# Patient Record
Sex: Male | Born: 1935 | Race: White | Hispanic: No | Marital: Married | Smoking: Never smoker
Health system: Southern US, Community
[De-identification: ages and names within clinical notes are randomized; demographics above are authoritative.]

## PROBLEM LIST (undated history)

## (undated) DIAGNOSIS — M1990 Unspecified osteoarthritis, unspecified site: Secondary | ICD-10-CM

## (undated) DIAGNOSIS — F0390 Unspecified dementia without behavioral disturbance: Secondary | ICD-10-CM

## (undated) DIAGNOSIS — N400 Enlarged prostate without lower urinary tract symptoms: Secondary | ICD-10-CM

## (undated) DIAGNOSIS — I10 Essential (primary) hypertension: Secondary | ICD-10-CM

## (undated) DIAGNOSIS — K589 Irritable bowel syndrome without diarrhea: Secondary | ICD-10-CM

## (undated) DIAGNOSIS — F419 Anxiety disorder, unspecified: Secondary | ICD-10-CM

## (undated) DIAGNOSIS — K219 Gastro-esophageal reflux disease without esophagitis: Secondary | ICD-10-CM

## (undated) HISTORY — PX: EYE SURGERY: SHX253

## (undated) HISTORY — PX: CHOLECYSTECTOMY: SHX55

---

## 1999-07-18 ENCOUNTER — Encounter: Admission: RE | Admit: 1999-07-18 | Discharge: 1999-07-18 | Payer: Self-pay | Admitting: Emergency Medicine

## 1999-07-18 ENCOUNTER — Encounter: Payer: Self-pay | Admitting: Emergency Medicine

## 1999-10-26 ENCOUNTER — Encounter: Payer: Self-pay | Admitting: Surgery

## 1999-10-26 ENCOUNTER — Ambulatory Visit (HOSPITAL_COMMUNITY): Admission: RE | Admit: 1999-10-26 | Discharge: 1999-10-26 | Payer: Self-pay | Admitting: Surgery

## 1999-11-05 ENCOUNTER — Ambulatory Visit (HOSPITAL_COMMUNITY): Admission: RE | Admit: 1999-11-05 | Discharge: 1999-11-05 | Payer: Self-pay | Admitting: Gastroenterology

## 1999-12-24 ENCOUNTER — Encounter: Payer: Self-pay | Admitting: Surgery

## 1999-12-28 ENCOUNTER — Inpatient Hospital Stay (HOSPITAL_COMMUNITY): Admission: RE | Admit: 1999-12-28 | Discharge: 1999-12-31 | Payer: Self-pay | Admitting: Surgery

## 2001-04-17 ENCOUNTER — Ambulatory Visit (HOSPITAL_COMMUNITY): Admission: RE | Admit: 2001-04-17 | Discharge: 2001-04-17 | Payer: Self-pay | Admitting: Gastroenterology

## 2001-08-17 ENCOUNTER — Encounter: Admission: RE | Admit: 2001-08-17 | Discharge: 2001-08-17 | Payer: Self-pay | Admitting: Emergency Medicine

## 2001-08-17 ENCOUNTER — Encounter: Payer: Self-pay | Admitting: Emergency Medicine

## 2003-01-07 ENCOUNTER — Encounter: Admission: RE | Admit: 2003-01-07 | Discharge: 2003-01-07 | Payer: Self-pay | Admitting: Orthopedic Surgery

## 2003-02-03 ENCOUNTER — Encounter: Admission: RE | Admit: 2003-02-03 | Discharge: 2003-02-03 | Payer: Self-pay | Admitting: Orthopedic Surgery

## 2003-02-07 ENCOUNTER — Ambulatory Visit (HOSPITAL_COMMUNITY): Admission: RE | Admit: 2003-02-07 | Discharge: 2003-02-07 | Payer: Self-pay | Admitting: Orthopedic Surgery

## 2003-02-07 ENCOUNTER — Ambulatory Visit (HOSPITAL_BASED_OUTPATIENT_CLINIC_OR_DEPARTMENT_OTHER): Admission: RE | Admit: 2003-02-07 | Discharge: 2003-02-07 | Payer: Self-pay | Admitting: Orthopedic Surgery

## 2003-09-29 ENCOUNTER — Encounter: Admission: RE | Admit: 2003-09-29 | Discharge: 2003-09-29 | Payer: Self-pay | Admitting: Emergency Medicine

## 2004-01-10 ENCOUNTER — Ambulatory Visit (HOSPITAL_COMMUNITY): Admission: RE | Admit: 2004-01-10 | Discharge: 2004-01-10 | Payer: Self-pay | Admitting: Surgery

## 2004-08-15 ENCOUNTER — Encounter: Admission: RE | Admit: 2004-08-15 | Discharge: 2004-08-15 | Payer: Self-pay | Admitting: Emergency Medicine

## 2004-12-24 ENCOUNTER — Encounter: Admission: RE | Admit: 2004-12-24 | Discharge: 2004-12-24 | Payer: Self-pay | Admitting: Orthopedic Surgery

## 2004-12-25 ENCOUNTER — Ambulatory Visit (HOSPITAL_COMMUNITY): Admission: RE | Admit: 2004-12-25 | Discharge: 2004-12-25 | Payer: Self-pay | Admitting: Orthopedic Surgery

## 2004-12-25 ENCOUNTER — Ambulatory Visit (HOSPITAL_BASED_OUTPATIENT_CLINIC_OR_DEPARTMENT_OTHER): Admission: RE | Admit: 2004-12-25 | Discharge: 2004-12-26 | Payer: Self-pay | Admitting: Orthopedic Surgery

## 2005-09-05 IMAGING — CT CT EXTREM LOW W/O CM*R*
1 of 4 series · 9 of 14 positions shown, 12 images · non-contrast
Comparison: none

CLINICAL DATA: Bilateral ankle pain secondary to trauma.  The patient stepped in a hole approximately 2-3 weeks ago.  
 CT SCAN OF THE RIGHT ANKLE WITHOUT CONTRAST
 Scans were performed in the coronal plane through the ankle and an axial plane through the foot.  Sagittal and coronal and axial reconstructions were performed.  The scan of the right ankle demonstrates that the patient has mild degenerative arthritis of the ankle joint, some mild joint space narrowing, and minimal spurring on the anterior aspect of the distal tibia.  There is also some mild degenerative arthritis of the posterior facet of the subtalar joint.  There is a tiny calcification seen at the posterior margin of the talocalcaneal joint.  I suspect there is some very slight marginal spurring rather than a loose body.  There is a small calcification adjacent to the tip of the medial malleolus which is felt to represent an old avulsion fragment.  There are minimal degenerative changes of the calcaneocuboid joint.  
 No other significant abnormality. 
 IMPRESSION
 Mild degenerative arthritic changes of the ankle joint with a small old avulsion from the tip of the medial malleolus.  Mild degenerative changes of the posterior aspect of the talocalcaneal joint and of the calcaneocuboid joint.  No acute bony abnormality of the right ankle. 
 MULTIPLANAR RECONSTRUCTIONS
 Multiplanar reconstructions of the right ankle better demonstrate the degenerative changes of the ankle joint and calcaneocuboid joint.  
 Multiplanar reconstructions better demonstrate the degenerative changes.  
 CT SCAN OF THE LEFT ANKLE WITHOUT CONTRAST
 Scans were performed in the coronal plane through the ankle and the axial plane through the foot and multiplanar reconstructions were performed in sagittal, coronal, and axial positions.  
 Scan demonstrates that the patient has moderately severe degenerative arthritis of the ankle joint with several calcified loose bodies in the posterior medial aspect of the ankle joint.  There are multiple old avulsions from the medial malleolus.  There is an osteochondral lesion of the medial posterior aspect of the dome of the talus and the loose bodies overly this area.  This lucency in the talar dome at that site is consistent with edema and osteopenia.  Bony articular surface of the posterior dome of the talus is quite irregular consistent with chronic degenerative changes.  
 The patient has multiple loose bodies in the posterior aspect of the ankle joint and in the posterior recess of the subtalar joint.  The patient has severe degenerative arthritis of the subtalar joint with small cystic subchondral erosions of the posterior facet of the talus.  There are mild degenerative changes of the middle facet of the subtalar joint as well.  There are prominent subchondral cysts of the lateral aspect of the posterior facet of the calcaneus.  
 There are mild degenerative arthritic changes of the calcaneocuboid joint with joint space narrowing.  There is no acute bony abnormality. 
 Severe degenerative arthritic changes of the subtalar joint and ankle joint with multiple loose bodies in both those joints.  Some of the loose bodies are actually hinged between the distal tibia and the dome of the talus and there is an osteochondral lesion of the medial aspect of the dome of the talus.  
 The multiplanar reconstructions of the left ankle better demonstrate the relationship of the numerous foreign bodies and better demonstrate the degenerative arthritic changes of the ankle joint and subtalar joint. 
 Multiplanar reconstructions better demonstrate the arthritic changes as described.

[Series 102: ankle lower ext · axial · 0.55mm/px · z∈[-119,+7]mm · 9 of 379 slices shown, 12 images]
[im 38/379  soft-tissue]
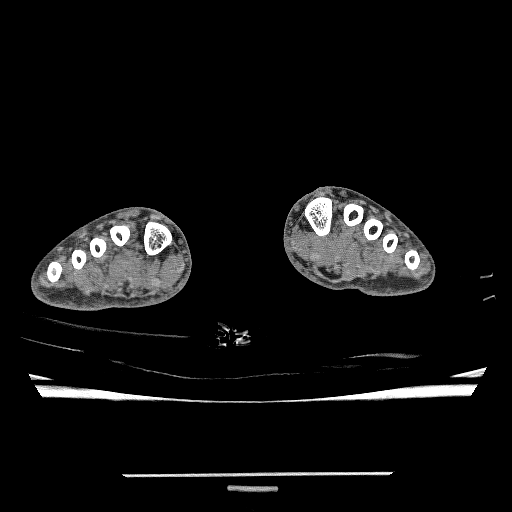
[im 38/379  bone]
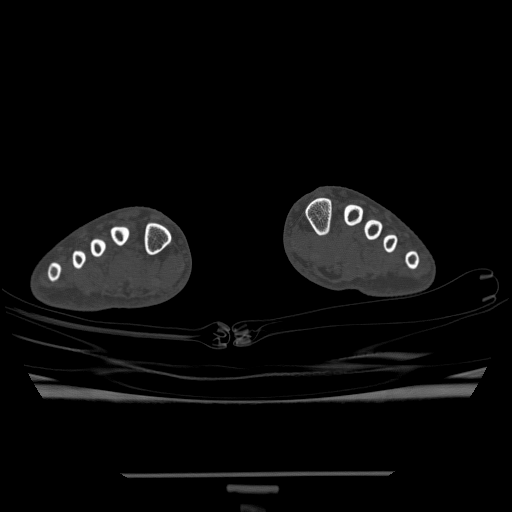
[im 76/379  bone]
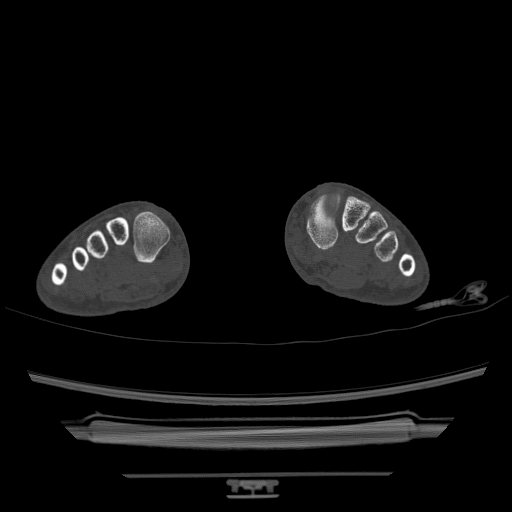
[im 114/379  bone]
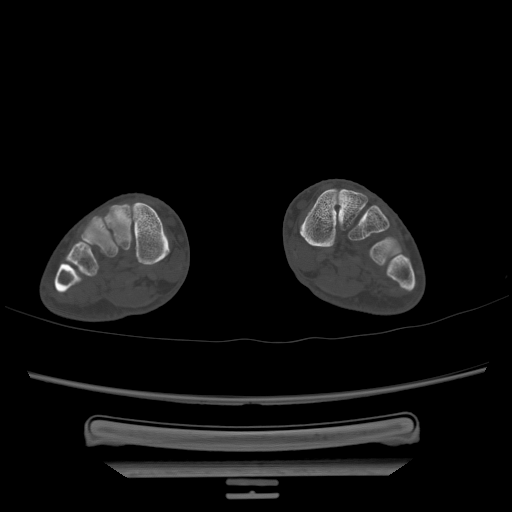
[im 152/379  bone]
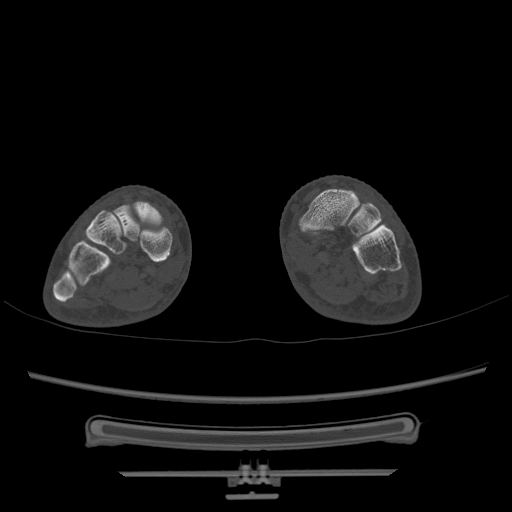
[im 190/379  soft-tissue]
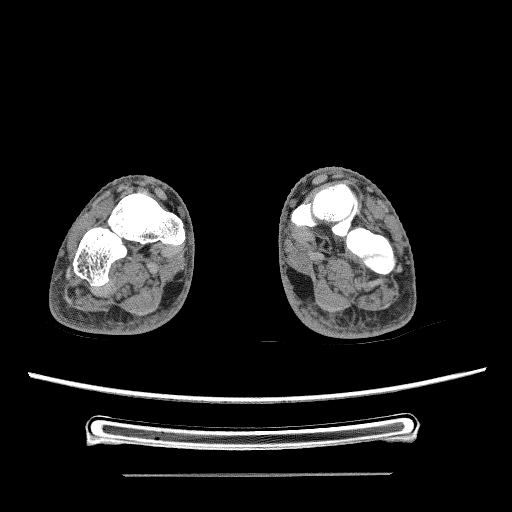
[im 190/379  bone]
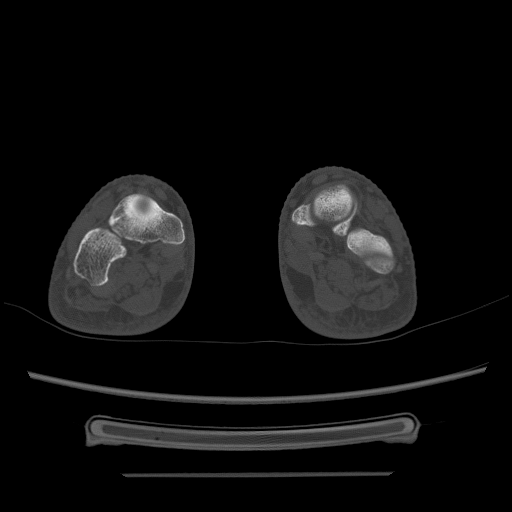
[im 227/379  bone]
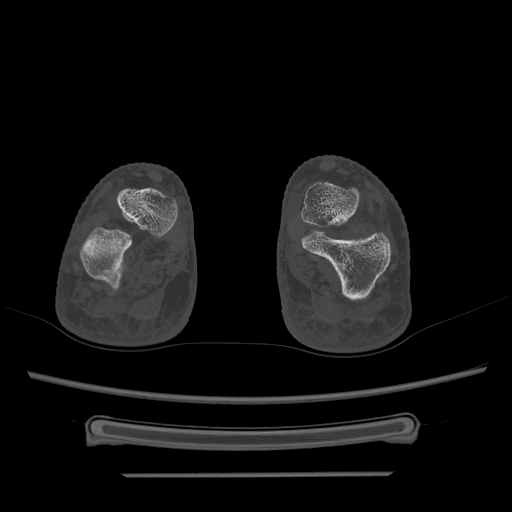
[im 265/379  bone]
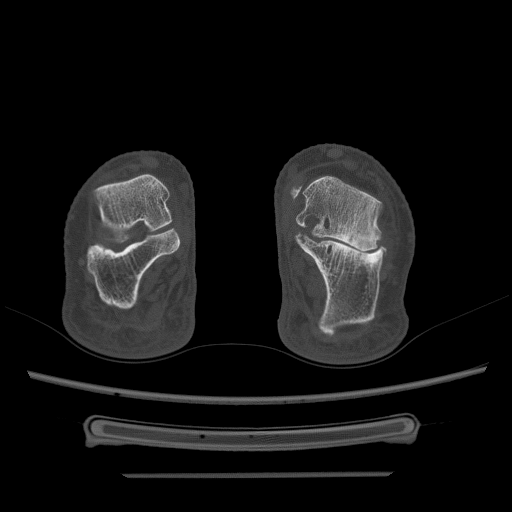
[im 303/379  bone]
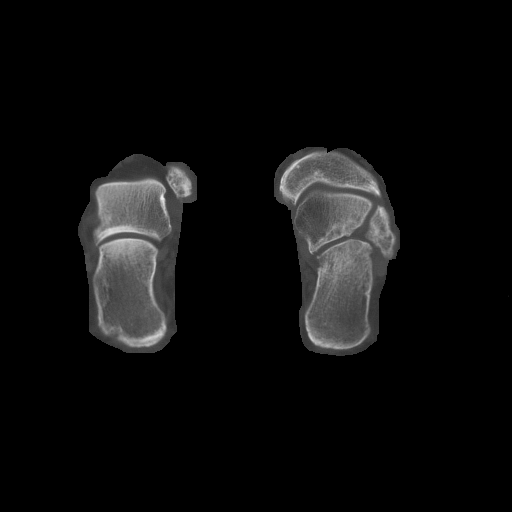
[im 341/379  soft-tissue]
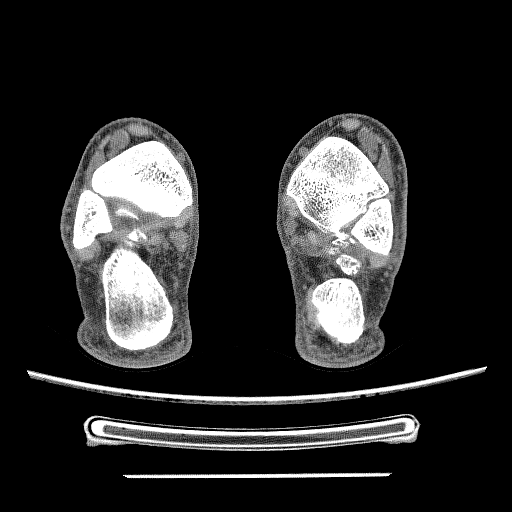
[im 341/379  bone]
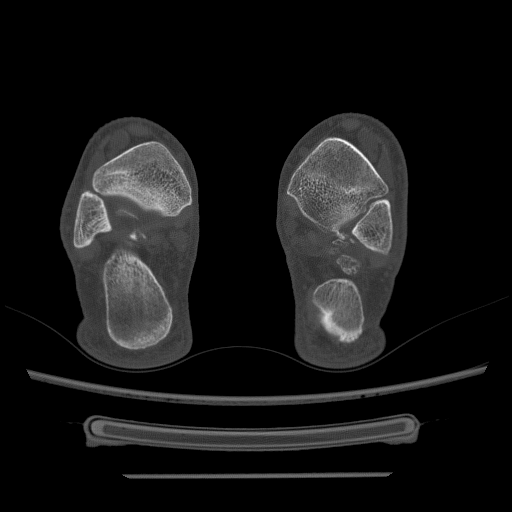

[9 of 14 positions shown; findings below may reference images not displayed]

## 2010-02-10 ENCOUNTER — Encounter: Payer: Self-pay | Admitting: Orthopedic Surgery

## 2010-03-27 ENCOUNTER — Other Ambulatory Visit: Payer: Self-pay | Admitting: Dermatology

## 2016-07-02 ENCOUNTER — Encounter (HOSPITAL_COMMUNITY): Payer: Self-pay | Admitting: Emergency Medicine

## 2016-07-02 ENCOUNTER — Emergency Department (HOSPITAL_COMMUNITY)
Admission: EM | Admit: 2016-07-02 | Discharge: 2016-07-02 | Disposition: A | Discharge status: Home / Self Care | Payer: Medicare Other | Attending: Emergency Medicine | Admitting: Emergency Medicine

## 2016-07-02 ENCOUNTER — Emergency Department (HOSPITAL_COMMUNITY): Payer: Medicare Other

## 2016-07-02 DIAGNOSIS — Z79899 Other long term (current) drug therapy: Secondary | ICD-10-CM | POA: Diagnosis not present

## 2016-07-02 DIAGNOSIS — I10 Essential (primary) hypertension: Secondary | ICD-10-CM | POA: Insufficient documentation

## 2016-07-02 DIAGNOSIS — M79671 Pain in right foot: Secondary | ICD-10-CM | POA: Diagnosis not present

## 2016-07-02 HISTORY — DX: Gastro-esophageal reflux disease without esophagitis: K21.9

## 2016-07-02 HISTORY — DX: Anxiety disorder, unspecified: F41.9

## 2016-07-02 HISTORY — DX: Unspecified dementia, unspecified severity, without behavioral disturbance, psychotic disturbance, mood disturbance, and anxiety: F03.90

## 2016-07-02 HISTORY — DX: Benign prostatic hyperplasia without lower urinary tract symptoms: N40.0

## 2016-07-02 HISTORY — DX: Unspecified osteoarthritis, unspecified site: M19.90

## 2016-07-02 HISTORY — DX: Irritable bowel syndrome, unspecified: K58.9

## 2016-07-02 HISTORY — DX: Essential (primary) hypertension: I10

## 2016-07-02 MED ORDER — IBUPROFEN 400 MG PO TABS: 400.0000 mg | ORAL_TABLET | Freq: Three times a day (TID) | ORAL | 0 refills | Status: DC | PRN

## 2016-07-02 NOTE — ED Notes (Signed)
ED Provider at bedside. 

## 2016-07-02 NOTE — Discharge Instructions (Signed)
Your foot pain is likely osteoarthritis or less likely a gout. Bear weight as tolerated. Follow-up with your Dr. As needed.

## 2016-07-02 NOTE — ED Provider Notes (Signed)
[facility]-EMERGENCY DEPT Provider Note   CSN: [id number] Arrival date & time: 07/02/16  4098     History   Chief Complaint Chief Complaint  Patient presents with  . Foot Swelling   Level 5 caveat due to dementia. HPI Jose Nichols is a 83 y.o. male.  HPI Patient sent in from nursing home for right foot redness and pain. Patient does have a history of dementia so difficulty and history from. Reportedly started last few days. Does have history of gout the patient does not know this. No known trauma. No fevers. States he feels fine besides the pain in the foot.   Past Medical History:  Diagnosis Date  . Anxiety   . Arthritis   . BPH (benign prostatic hyperplasia)   . Dementia   . GERD (gastroesophageal reflux disease)   . Hypertension   . IBS (irritable bowel syndrome)     There are no active problems to display for this patient.   Past Surgical History:  Procedure Laterality Date  . CHOLECYSTECTOMY    . EYE SURGERY         Home Medications    Prior to Admission medications   Medication Sig Start Date End Date Taking? Authorizing Provider  ibuprofen (ADVIL,MOTRIN) 400 MG tablet Take 1 tablet (400 mg total) by mouth every 8 (eight) hours as needed. 07/02/16   Benjiman Core, MD    Family History No family history on file.  Social History Social History  Substance Use Topics  . Smoking status: Never Smoker  . Smokeless tobacco: Never Used  . Alcohol use No     Allergies   Patient has no known allergies.   Review of Systems Review of Systems  Unable to perform ROS: Dementia     Physical Exam Updated Vital Signs BP 133/84   Pulse 83   Temp 98.3 F (36.8 C) (Oral)   Resp 15   SpO2 99%   Physical Exam  Constitutional: He appears well-developed and well-nourished.  HENT:  Head: Atraumatic.  Pulmonary/Chest: Effort normal.  Abdominal: There is no tenderness.  Musculoskeletal: He exhibits tenderness.  Mild erythema and tenderness over right  first MTP joint. No real irritability joint. No fluctuance. No tenderness over ankle or toes. pulse intact.   Neurological: He is alert.  Skin: Skin is warm. Capillary refill takes less than 2 seconds.     ED Treatments / Results  Labs (all labs ordered are listed, but only abnormal results are displayed) Labs Reviewed - No data to display  EKG  EKG Interpretation None       Radiology Dg Foot Complete Right  Result Date: 07/02/2016 CLINICAL DATA:  Per [facility] patient comes from [facility] for right foot swelling and erythema x 3 days. Patient has dementia and unsure if fell or injured foot to cause symptoms. patient c/o pain with movement and bearing weight EXAM: RIGHT FOOT COMPLETE - 3+ VIEW COMPARISON:  CT, 01/07/2003 FINDINGS: No fracture or dislocation. No bone lesion. No bone resorption is seen to suggest osteomyelitis. There is asymmetric joint space narrowing of the first metatarsophalangeal joint with small marginal osteophytes consistent with osteoarthritis. Remaining joints are normally spaced and aligned. Mild dorsal subcutaneous soft tissue edema. Soft tissues otherwise unremarkable. IMPRESSION: 1. No fracture, dislocation or acute finding. No evidence of osteomyelitis. 2. Moderate first metatarsophalangeal joint osteoarthritis. Electronically Signed   By: Amie Portland M.D.   On: 07/02/2016 09:51    Procedures Procedures (including critical care time)  Medications Ordered in ED Medications - No data to display   Initial Impression / Assessment and Plan / ED Course  I have reviewed the triage vital signs and the nursing notes.  Pertinent labs & imaging results that were available during my care of the patient were reviewed by me and considered in my medical decision making (see chart for details).     Patient with foot pain.. Some tenderness medially. Gout considered but thought less likely. More likely osteoarthritis. Tenderness laterally. Does not  appear to be a cellulitis. Discharge back to nursing home.  Final Clinical Impressions(s) / ED Diagnoses   Final diagnoses:  Right foot pain    New Prescriptions Discharge Medication List as of 07/02/2016 10:56 AM    START taking these medications   Details  ibuprofen (ADVIL,MOTRIN) 400 MG tablet Take 1 tablet (400 mg total) by mouth every 8 (eight) hours as needed., Starting Tue 07/02/2016, Print         Benjiman CorePickering, Wilberta Dorvil, MD 07/02/16 1622

## 2016-07-02 NOTE — ED Notes (Signed)
Patient ambulated with one staff assist to bathroom, which is approx 50 feet.  Made Jose CruiseKristin social worker aware about gait.  Reports that they have PT at facility that can evaluate him when he gets back there if he needs any assistance with devices or ADLS.

## 2016-07-02 NOTE — ED Notes (Signed)
Kristin [facility]ocial [facility] asking if patient wasn't weight bearing than they have an open room in assisted living part he can go to, would just need an FL-2 filled out.  Made Dr Rubin PayorPickering aware.  Dr Rubin PayorPickering states that patient has no reason not to be able to bear weight on right foot.   Informed EDP that I would ambulate patient to see how he does.

## 2016-07-02 NOTE — ED Notes (Signed)
Bed: WA15 Expected date:  Expected time:  Means of arrival:  Comments: EMS  

## 2016-07-02 NOTE — ED Triage Notes (Signed)
Per [facility] foot swelling and erythema x 3 days. Patient has dementia and unsure if fell or injured foot to cause symptoms. patient c/o pain with movement and bearing weight

## 2017-02-05 ENCOUNTER — Encounter: Payer: Self-pay | Admitting: *Deleted

## 2017-02-06 ENCOUNTER — Ambulatory Visit: Payer: Medicare Other | Admitting: Nurse Practitioner

## 2017-02-06 ENCOUNTER — Encounter: Payer: Self-pay | Admitting: Nurse Practitioner

## 2017-02-06 ENCOUNTER — Encounter: Payer: Self-pay | Admitting: *Deleted

## 2017-02-06 VITALS — BP 122/78 | HR 87 | Temp 98.0°F | Resp 20 | Ht 70.0 in | Wt 193.6 lb

## 2017-02-06 DIAGNOSIS — F324 Major depressive disorder, single episode, in partial remission: Secondary | ICD-10-CM

## 2017-02-06 DIAGNOSIS — G4700 Insomnia, unspecified: Secondary | ICD-10-CM | POA: Insufficient documentation

## 2017-02-06 DIAGNOSIS — R1314 Dysphagia, pharyngoesophageal phase: Secondary | ICD-10-CM

## 2017-02-06 DIAGNOSIS — F329 Major depressive disorder, single episode, unspecified: Secondary | ICD-10-CM | POA: Insufficient documentation

## 2017-02-06 DIAGNOSIS — G301 Alzheimer's disease with late onset: Secondary | ICD-10-CM

## 2017-02-06 DIAGNOSIS — F0280 Dementia in other diseases classified elsewhere without behavioral disturbance: Secondary | ICD-10-CM | POA: Diagnosis not present

## 2017-02-06 DIAGNOSIS — R1310 Dysphagia, unspecified: Secondary | ICD-10-CM | POA: Insufficient documentation

## 2017-02-06 DIAGNOSIS — K219 Gastro-esophageal reflux disease without esophagitis: Secondary | ICD-10-CM | POA: Diagnosis not present

## 2017-02-06 DIAGNOSIS — F5101 Primary insomnia: Secondary | ICD-10-CM

## 2017-02-06 DIAGNOSIS — F32A Depression, unspecified: Secondary | ICD-10-CM | POA: Insufficient documentation

## 2017-02-06 NOTE — Progress Notes (Signed)
Provider:  Chipper Oman NP Location:   Clinic [facility]   Place of Service:   Clinic [facility]  PCP: System, Pcp Not In Patient Care Team: System, Pcp Not In as PCP - General  Extended Emergency Contact Information Primary Emergency Contact: [facility] Address: [address]          [place],  [address] Home Phone: [phone] Relation: None  Code Status: DNR Goals of Care: Advanced Directive information Advanced Directives 07/02/2016  Does Patient Have a Medical Advance Directive? Yes  Type of Advance Directive Healthcare Power of Attorney  Copy of Healthcare Power of Attorney in Chart? Yes      Chief Complaint  Patient presents with  . Establish Care    HPI: Patient is a 84 y.o. male seen today for admission to [facility]. The patient has history of Alzheimer's dementia, per his daughter over the phone: he had MRI in the past, confirmed his Alzheimer's dementia, failed memory preserving meds, he refused AL or Memory Care Unit placement.   History of GERD, taking Omeprazole 40mg  bid, Carafate Igm qid. Insomnia, he is saying he doesn't sleep well at night, taking Trazodone 50mg  qhs.    Past Medical History:  Diagnosis Date  . Anxiety   . Arthritis   . BPH (benign prostatic hyperplasia)   . Dementia   . GERD (gastroesophageal reflux disease)   . Hypertension   . IBS (irritable bowel syndrome)    Past Surgical History:  Procedure Laterality Date  . CHOLECYSTECTOMY    . EYE SURGERY      reports that  has never smoked. he has never used smokeless tobacco. He reports that he does not drink alcohol or use drugs. Social History   Socioeconomic History  . Marital status: Married    Spouse name: Not on file  . Number of children: Not on file  . Years of education: Not on file  . Highest education level: Not on file  Social Needs  . Financial resource strain: Not on file  . Food insecurity - worry: Not on file  . Food insecurity - inability: Not on file  . Transportation needs -  medical: Not on file  . Transportation needs - non-medical: Not on file  Occupational History  . Not on file  Tobacco Use  . Smoking status: Never Smoker  . Smokeless tobacco: Never Used  Substance and Sexual Activity  . Alcohol use: No  . Drug use: No  . Sexual activity: Not on file  Other Topics Concern  . Not on file  Social History Narrative   Do you drink/eat things with caffeine?   Marital status: Widowed, What year were you married? 1954   Do you live in a apartment? Yes    How many people live in the home? 1   Do you have pets? No   Current or past profession: Professor Psychology  - [employer]   Do you exercise? Yes, walk   Do you have a living will? Yes     Functional Status Survey:    Family History  Problem Relation Age of Onset  . Heart disease Father     Health Maintenance  Topic Date Due  . TETANUS/TDAP  07/06/1951  . PNA vac Low Risk Adult (1 of 2 - PCV13) 07/05/1997  . INFLUENZA VACCINE  08/21/2016    No Known Allergies  Allergies as of 02/06/2017   No Known Allergies     Medication List  Accurate as of 02/06/17  6:01 PM. Always use your most recent med list.          DYMISTA 137-50 MCG/ACT Susp Generic drug:  Azelastine-Fluticasone PLACE ONE SPRAY INTO EACH NOSTRIL TWICE A DAY   ibuprofen 400 MG tablet Commonly known as:  ADVIL,MOTRIN Take 1 tablet (400 mg total) by mouth every 8 (eight) hours as needed.   omeprazole 40 MG capsule Commonly known as:  PRILOSEC Take 40 mg by mouth every 12 (twelve) hours.   sucralfate 1 g tablet Commonly known as:  CARAFATE Take 1 g by mouth 4 (four) times daily.   traZODone 50 MG tablet Commonly known as:  DESYREL Take 50 mg by mouth at bedtime.      ROS was provided with assistance of the patient's POA daughter.  Review of Systems  Constitutional: Negative for activity change, appetite change, chills, diaphoresis, fatigue and fever.  HENT: Positive for hearing loss and trouble  swallowing. Negative for congestion and voice change.   Eyes: Negative for visual disturbance.  Respiratory: Negative for choking, chest tightness, shortness of breath and wheezing.   Cardiovascular: Positive for leg swelling. Negative for chest pain and palpitations.  Gastrointestinal: Negative for abdominal distention, abdominal pain, constipation, diarrhea, nausea and vomiting.  Genitourinary: Negative for difficulty urinating, dysuria, frequency and urgency.  Musculoskeletal: Negative for arthralgias, back pain, gait problem and joint swelling.  Skin: Negative for color change and pallor.  Neurological: Positive for speech difficulty. Negative for tremors, weakness and headaches.       Difficulty finding words. dementia  Psychiatric/Behavioral: Positive for confusion, hallucinations and sleep disturbance. Negative for agitation and behavioral problems. The patient is not nervous/anxious.     Vitals:   02/06/17 1611  BP: 122/78  Pulse: 87  Resp: 20  Temp: 98 F (36.7 C)  TempSrc: Oral  SpO2: 94%  Weight: 193 lb 9.6 oz (87.8 kg)  Height: 5\' 10"  (1.778 m)   Body mass index is 27.78 kg/m. Physical Exam  Constitutional: He appears well-developed and well-nourished. No distress.  HENT:  Head: Normocephalic and atraumatic.  Mouth/Throat: Oropharynx is clear and moist. No oropharyngeal exudate.  Eyes: Conjunctivae are normal. Pupils are equal, round, and reactive to light. Right eye exhibits no discharge. Left eye exhibits no discharge.  Neck: Normal range of motion. Neck supple. No JVD present. No thyromegaly present.  Cardiovascular: Normal rate, regular rhythm, normal heart sounds and intact distal pulses.  No murmur heard. Pulmonary/Chest: Effort normal and breath sounds normal. He has no wheezes. He has no rales.  Abdominal: Soft. Bowel sounds are normal. He exhibits no distension. There is no tenderness. There is no rebound.  Musculoskeletal: Normal range of motion. He  exhibits edema. He exhibits no tenderness.  Trace edema in BLE  Neurological: He is alert. He exhibits normal muscle tone. Coordination normal.  Oriented to self  Skin: Skin is warm and dry. No rash noted. He is not diaphoretic. No erythema.  Psychiatric: He has a normal mood and affect.    Labs reviewed: Basic Metabolic Panel: No results for input(s): NA, K, CL, CO2, GLUCOSE, BUN, CREATININE, CALCIUM, MG, PHOS in the last 8760 hours. Liver Function Tests: No results for input(s): AST, ALT, ALKPHOS, BILITOT, PROT, ALBUMIN in the last 8760 hours. No results for input(s): LIPASE, AMYLASE in the last 8760 hours. No results for input(s): AMMONIA in the last 8760 hours. CBC: No results for input(s): WBC, NEUTROABS, HGB, HCT, MCV, PLT in the last 8760 hours. Cardiac Enzymes:  No results for input(s): CKTOTAL, CKMB, CKMBINDEX, TROPONINI in the last 8760 hours. BNP: Invalid input(s): POCBNP No results found for: HGBA1C No results found for: TSH No results found for: VITAMINB12 No results found for: FOLATE No results found for: IRON, TIBC, FERRITIN  Imaging and Procedures obtained prior to SNF admission: Dg Foot Complete Right  Result Date: 07/02/2016 CLINICAL DATA:  Per [facility] comes from [facility] for right foot swelling and erythema x 3 days. Patient has dementia and unsure if fell or injured foot to cause symptoms. patient c/o pain with movement and bearing weight EXAM: RIGHT FOOT COMPLETE - 3+ VIEW COMPARISON:  CT, 01/07/2003 FINDINGS: No fracture or dislocation. No bone lesion. No bone resorption is seen to suggest osteomyelitis. There is asymmetric joint space narrowing of the first metatarsophalangeal joint with small marginal osteophytes consistent with osteoarthritis. Remaining joints are normally spaced and aligned. Mild dorsal subcutaneous soft tissue edema. Soft tissues otherwise unremarkable. IMPRESSION: 1. No fracture, dislocation or acute finding. No evidence  of osteomyelitis. 2. Moderate first metatarsophalangeal joint osteoarthritis. Electronically Signed   By: Amie Portland M.D.   On: 07/02/2016 09:51    Assessment/Plan  Late onset Alzheimer's disease without behavioral disturbance The patient has history of Alzheimer's dementia, per his daughter over the phone: he had MRI in the past, confirmed his Alzheimer's dementia, failed memory preserving meds, he refused AL or Memory Care Unit placement.  MMSE 11/30 today.  POA daughter is aware of the patient's needs for higher level of care. Hallucination was reported on the new patient registration symptoms check list.  Obtain CBC, CMP, TSH, lipid panel.   GERD (gastroesophageal reflux disease) History of GERD, taking Omeprazole 40mg  bid, Carafate Igm tab qid-he said he has difficulty swallowing the pills, will change to liquid form of Carafate to try  Insomnia  Insomnia, he is saying he doesn't sleep well at night, continue Trazodone 50mg  qhs.    Dysphagia Pill dysphagia, change to lipid form of meds as needed. Will ST to evaluate  Depression Reported on the new patient registration symptom check list, continue Trazodone   Family/ staff Communication: plan of care reviewed with the patient, patient's POA daughter.   Labs/tests ordered: CBC CMP Lipid panel TSH  Time spend 35 minutes.

## 2017-02-06 NOTE — Assessment & Plan Note (Signed)
Reported on the new patient registration symptom check list, continue Trazodone

## 2017-02-06 NOTE — Assessment & Plan Note (Signed)
Insomnia, he is saying he doesn't sleep well at night, continue Trazodone 50mg  qhs.

## 2017-02-06 NOTE — Assessment & Plan Note (Signed)
Pill dysphagia, change to lipid form of meds as needed. Will ST to evaluate

## 2017-02-06 NOTE — Assessment & Plan Note (Signed)
History of GERD, taking Omeprazole 40mg  bid, Carafate Igm tab qid-he said he has difficulty swallowing the pills, will change to liquid form of Carafate to try

## 2017-02-06 NOTE — Patient Instructions (Addendum)
F/u one month, CBC CMP TSH lipid prior to the next appointment. ST to evaluate swallowing.

## 2017-02-06 NOTE — Assessment & Plan Note (Addendum)
The patient has history of Alzheimer's dementia, per his daughter over the phone: he had MRI in the past, confirmed his Alzheimer's dementia, failed memory preserving meds, he refused AL or Memory Care Unit placement.  MMSE 11/30 today.  POA daughter is aware of the patient's needs for higher level of care. Hallucination was reported on the new patient registration symptoms check list.  Obtain CBC, CMP, TSH, lipid panel.

## 2017-02-10 ENCOUNTER — Encounter: Payer: Self-pay | Admitting: *Deleted

## 2017-02-18 ENCOUNTER — Non-Acute Institutional Stay (SKILLED_NURSING_FACILITY): Payer: Medicare Other | Admitting: Internal Medicine

## 2017-02-18 ENCOUNTER — Encounter: Payer: Self-pay | Admitting: Internal Medicine

## 2017-02-18 DIAGNOSIS — R109 Unspecified abdominal pain: Secondary | ICD-10-CM | POA: Diagnosis not present

## 2017-02-18 DIAGNOSIS — J309 Allergic rhinitis, unspecified: Secondary | ICD-10-CM | POA: Diagnosis not present

## 2017-02-18 DIAGNOSIS — I10 Essential (primary) hypertension: Secondary | ICD-10-CM | POA: Diagnosis not present

## 2017-02-18 DIAGNOSIS — F323 Major depressive disorder, single episode, severe with psychotic features: Secondary | ICD-10-CM | POA: Insufficient documentation

## 2017-02-18 DIAGNOSIS — G309 Alzheimer's disease, unspecified: Secondary | ICD-10-CM | POA: Diagnosis not present

## 2017-02-18 DIAGNOSIS — R7303 Prediabetes: Secondary | ICD-10-CM | POA: Diagnosis not present

## 2017-02-18 DIAGNOSIS — K219 Gastro-esophageal reflux disease without esophagitis: Secondary | ICD-10-CM | POA: Diagnosis not present

## 2017-02-18 DIAGNOSIS — R1314 Dysphagia, pharyngoesophageal phase: Secondary | ICD-10-CM

## 2017-02-18 DIAGNOSIS — F0281 Dementia in other diseases classified elsewhere with behavioral disturbance: Secondary | ICD-10-CM | POA: Diagnosis not present

## 2017-02-18 DIAGNOSIS — F339 Major depressive disorder, recurrent, unspecified: Secondary | ICD-10-CM

## 2017-02-18 DIAGNOSIS — F5101 Primary insomnia: Secondary | ICD-10-CM | POA: Diagnosis not present

## 2017-02-18 MED ORDER — BUPROPION HCL 100 MG PO TABS: 100.0000 mg | ORAL_TABLET | Freq: Every day | ORAL | Status: DC

## 2017-02-18 NOTE — Progress Notes (Signed)
Provider:  Oneal GroutMahima Aili Casillas MD  Location:  [facility] Room Number: 108 Place of Service:  SNF (31)  PCP: Oneal GroutPandey, Ashwin Tibbs, MD Patient Care Team: Oneal GroutPandey, Jacinto Keil, MD as PCP - General (Internal Medicine) Mast, Man X, NP as Nurse Practitioner (Internal Medicine)  Extended Emergency Contact Information Primary Emergency Contact: [facility] Address: [address]          [place],  [id number] Home Phone: [phone] Relation: None  Code Status: full code  Goals of Care: Advanced Directive information Advanced Directives 02/18/2017  Does Patient Have a Medical Advance Directive? Yes  Type of Advance Directive Healthcare Power of Attorney  Does patient want to make changes to medical advance directive? No - Patient declined  Copy of Healthcare Power of Attorney in Chart? Yes      Chief Complaint  Patient presents with  . New Admit To SNF    New Admission Visit     HPI: Patient is a 84 y.o. male seen today for admission visit. He was residing in independent living prior to this. He is now been admitted in memory care unit due to advanced dementia and increased care needs. He has medical history of dementia, GERD, HTN, Arthritis and IBS among others. Limited HPI and ROS with his dementia. Last MMSE 11/30 on 02/06/17. [facility]   Dementia with psychosis- currently on galantamine 8 mg bid, followed by neurology, did not tolerate aricept in past with GI upset. Occasional hallucination noted by family but none reported by nursing so far. Also on bupropion 100 mg daily.  Insomnia- taking trazodone 50 mg qhs for now. Overall sleeps fair.  gerd- on omeprazole 40 mg daily with sucralfate 1 g qid.   Allergic rhinitis- on fexofenadine 180 mg daily  Hypertension- well controlled readings, off antihypertensives.   Prediabetes- most recent a1c 5.4 with blood glucose 113. Improved from last a1c of 5.7. Per 11/07/16 notes.   Major depression-  recurrent, reviewed prior note, currently on bupropion 100 mg daily.   Primary open angle glaucoma- both eyes, severe stage. S/p trabulectomy in left eye 10/17/15 and right eye 2014.    Past Medical History:  Diagnosis Date  . Anxiety   . Arthritis   . BPH (benign prostatic hyperplasia)   . Dementia   . GERD (gastroesophageal reflux disease)   . Hypertension   . IBS (irritable bowel syndrome)    Past Surgical History:  Procedure Laterality Date  . CHOLECYSTECTOMY    . EYE SURGERY      reports that  has never smoked. he has never used smokeless tobacco. He reports that he does not drink alcohol or use drugs. Social History   Socioeconomic History  . Marital status: Married    Spouse name: Not on file  . Number of children: Not on file  . Years of education: Not on file  . Highest education level: Not on file  Social Needs  . Financial resource strain: Not on file  . Food insecurity - worry: Not on file  . Food insecurity - inability: Not on file  . Transportation needs - medical: Not on file  . Transportation needs - non-medical: Not on file  Occupational History  . Not on file  Tobacco Use  . Smoking status: Never Smoker  . Smokeless tobacco: Never Used  Substance and Sexual Activity  . Alcohol use: No  . Drug use: No  . Sexual activity: Not on file  Other Topics Concern  .  Not on file  Social History Narrative   Do you drink/eat things with caffeine?   Marital status: Widowed, What year were you married? 1954   Do you live in a apartment? Yes    How many people live in the home? 1   Do you have pets? No   Current or past profession: Professor Psychology  - [employer]   Do you exercise? Yes, walk   Do you have a living will? Yes     Functional Status Survey:    Family History  Problem Relation Age of Onset  . Heart disease Father     Health Maintenance  Topic Date Due  . TETANUS/TDAP  07/06/1949  . PNA vac Low Risk Adult (1 of 2 - PCV13)  07/05/1997  . INFLUENZA VACCINE  Completed    No Known Allergies  Outpatient Encounter Medications as of 02/18/2017  Medication Sig  . buPROPion (WELLBUTRIN) 100 MG tablet Take 100 mg by mouth daily.  . fexofenadine (ALLEGRA) 180 MG tablet Take 180 mg by mouth daily.  Marland Kitchen galantamine (RAZADYNE) 8 MG tablet Take 8 mg by mouth 2 (two) times daily.  Marland Kitchen omeprazole (PRILOSEC) 40 MG capsule Take 40 mg by mouth daily.   . sucralfate (CARAFATE) 1 g tablet Take 1 g by mouth 4 (four) times daily.  . traZODone (DESYREL) 50 MG tablet Take 50 mg by mouth at bedtime.  . [DISCONTINUED] DYMISTA 137-50 MCG/ACT SUSP PLACE ONE SPRAY INTO EACH NOSTRIL TWICE A DAY  . [DISCONTINUED] ibuprofen (ADVIL,MOTRIN) 400 MG tablet Take 1 tablet (400 mg total) by mouth every 8 (eight) hours as needed.   No facility-administered encounter medications on file as of 02/18/2017.     Review of Systems  Reason unable to perform ROS: limited due to dementia, has somewhat pressure speech and difficulty expressing himself.   Constitutional: Positive for appetite change. Negative for chills and fever.  HENT: Positive for congestion and trouble swallowing.        Trouble swallowing carafate pills. No noticed trouble with meals per nursing.   Respiratory: Negative for cough and shortness of breath.   Cardiovascular: Negative for chest pain and palpitations.  Gastrointestinal: Positive for abdominal pain. Negative for blood in stool, constipation, diarrhea, nausea and vomiting.       Has episodes of abdominal discomfort that carafate relieves. Has history if IBS and is s/p cholecystectomy. He mentions having diarrhea but no loose stool documented by nursing  Genitourinary: Negative for dysuria.       Continent with bowel and bladder. Wakes up twice at night to void  Musculoskeletal: Positive for arthralgias and gait problem. Negative for back pain.  Skin: Negative for rash.  Neurological: Negative for dizziness and headaches.    Psychiatric/Behavioral: Positive for confusion. The patient is nervous/anxious.     Vitals:   02/18/17 1444  BP: 120/76  Pulse: 86  Resp: 18  Temp: 97.9 F (36.6 C)  TempSrc: Oral  SpO2: 95%  Weight: 194 lb (88 kg)  Height: 5\' 10"  (1.778 m)   Body mass index is 27.84 kg/m.   Wt Readings from Last 3 Encounters:  02/18/17 194 lb (88 kg)  02/06/17 193 lb 9.6 oz (87.8 kg)   Physical Exam  Constitutional: He appears well-developed and well-nourished. No distress.  HENT:  Head: Normocephalic and atraumatic.  Mouth/Throat: Oropharynx is clear and moist.  Nares patent, mildly edematous and pale  Eyes: Conjunctivae and EOM are normal. Pupils are equal, round, and reactive to light.  Right eye exhibits no discharge. Left eye exhibits no discharge.  Has corrective glasses  Neck: Normal range of motion. Neck supple.  Cardiovascular: Normal rate, regular rhythm and intact distal pulses.  No murmur heard. Pulmonary/Chest: Effort normal and breath sounds normal. No respiratory distress. He has no wheezes.  Abdominal: Soft. Bowel sounds are normal. He exhibits no distension. There is no guarding.  Musculoskeletal: He exhibits edema.  Trace leg edema, able to move all 4 extremities, AROM  Lymphadenopathy:    He has no cervical adenopathy.  Neurological: He is alert. No cranial nerve deficit. He exhibits normal muscle tone.  Oriented to self and place  Skin: Skin is warm and dry. No rash noted. He is not diaphoretic. No erythema.  Keratotic lesions noted on his scalp  Psychiatric:  Appears anxious    Labs reviewed: Basic Metabolic Panel: No results for input(s): NA, K, CL, CO2, GLUCOSE, BUN, CREATININE, CALCIUM, MG, PHOS in the last 8760 hours. Liver Function Tests: No results for input(s): AST, ALT, ALKPHOS, BILITOT, PROT, ALBUMIN in the last 8760 hours. No results for input(s): LIPASE, AMYLASE in the last 8760 hours. No results for input(s): AMMONIA in the last 8760  hours. CBC: No results for input(s): WBC, NEUTROABS, HGB, HCT, MCV, PLT in the last 8760 hours. Cardiac Enzymes: No results for input(s): CKTOTAL, CKMB, CKMBINDEX, TROPONINI in the last 8760 hours. BNP: Invalid input(s): POCBNP No results found for: HGBA1C No results found for: TSH No results found for: VITAMINB12 No results found for: FOLATE No results found for: IRON, TIBC, FERRITIN  Imaging and Procedures obtained prior to SNF admission: Dg Foot Complete Right  Result Date: 07/02/2016 CLINICAL DATA:  Per [facility] for right foot swelling and erythema x 3 days. Patient has dementia and unsure if fell or injured foot to cause symptoms. patient c/o pain with movement and bearing weight EXAM: RIGHT FOOT COMPLETE - 3+ VIEW COMPARISON:  CT, 01/07/2003 FINDINGS: No fracture or dislocation. No bone lesion. No bone resorption is seen to suggest osteomyelitis. There is asymmetric joint space narrowing of the first metatarsophalangeal joint with small marginal osteophytes consistent with osteoarthritis. Remaining joints are normally spaced and aligned. Mild dorsal subcutaneous soft tissue edema. Soft tissues otherwise unremarkable. IMPRESSION: 1. No fracture, dislocation or acute finding. No evidence of osteomyelitis. 2. Moderate first metatarsophalangeal joint osteoarthritis. Electronically Signed   By: Amie Portland M.D.   On: 07/02/2016 09:51    Assessment/Plan  gerd Controlled symptom. Continue omeprazole 40 mg daily. With his difficulty taking sucralfate 1 g qid pills, change this to suspension and monitor  Dysphagia With pills and cognitive impairment, SLP consult, aspiration precautions  Insomnia Continue traozodone, no changes made  Dementia with psyhosis Cognition stable. Continue galantamine and bupropion, supportive care, have pt engaged in activities as tolerated, given his high functionality with ADLs besides memory, he might benefit from  being in SNF or being involved in more group activities if appropriate  Major depression Supportive care especially given his environmental changes. Continue bupropion and monitor  Abdominal cramping His history of IBS and post cholecystectomy syndrome could both be contributing some. Monitor for bowel movement- diarrhea or consitpation. Currently symptom free.  Allergic rhinitis Continue fexofenadine 180 mg daily  Prediabetes Monitor blood glucose periodically  HTN Stable readings, check BP daily for 2 weeks and then once a week, monitor, check cmp    Family/ staff Communication: reviewed care plan with patient and charge nurse. Communicated with social  worker to help arrange meeting with family member to review goals of care.    Labs/tests ordered: cbc, cmp, a1c  Oneal Grout, MD Internal Medicine [facility] [address] [place], [place] [address] Cell Phone (Monday-Friday 8 am - 5 pm): [phone] On Call: [phone] and follow prompts after 5 pm and on weekends Office Phone: [phone] Office Fax: [phone]

## 2017-02-20 LAB — CBC AND DIFFERENTIAL
HEMATOCRIT: 40 — AB (ref 41–53)
HEMOGLOBIN: 14 (ref 13.5–17.5)
Platelets: 140 — AB (ref 150–399)
WBC: 7.5

## 2017-02-20 LAB — BASIC METABOLIC PANEL
BUN: 15 (ref 4–21)
Creatinine: 1 (ref <=1.3)
GLUCOSE: 97
POTASSIUM: 4.1 (ref 3.4–5.3)
Sodium: 138 (ref 137–147)

## 2017-02-20 LAB — HEPATIC FUNCTION PANEL
ALT: 13 (ref 10–40)
AST: 14 (ref 14–40)
Alkaline Phosphatase: 56 (ref 25–125)
Bilirubin, Total: 1

## 2017-02-20 LAB — HEMOGLOBIN A1C: Hemoglobin A1C: 5.4

## 2017-02-24 ENCOUNTER — Other Ambulatory Visit: Payer: Self-pay | Admitting: *Deleted

## 2017-03-19 ENCOUNTER — Non-Acute Institutional Stay (SKILLED_NURSING_FACILITY): Payer: Medicare Other | Admitting: Nurse Practitioner

## 2017-03-19 ENCOUNTER — Encounter: Payer: Self-pay | Admitting: Nurse Practitioner

## 2017-03-19 DIAGNOSIS — F0281 Dementia in other diseases classified elsewhere with behavioral disturbance: Secondary | ICD-10-CM

## 2017-03-19 DIAGNOSIS — G309 Alzheimer's disease, unspecified: Secondary | ICD-10-CM

## 2017-03-19 DIAGNOSIS — K219 Gastro-esophageal reflux disease without esophagitis: Secondary | ICD-10-CM

## 2017-03-19 DIAGNOSIS — F339 Major depressive disorder, recurrent, unspecified: Secondary | ICD-10-CM | POA: Diagnosis not present

## 2017-03-19 NOTE — Progress Notes (Signed)
Location:  [facility] Room Number: [phone] Place of Service:  SNF (31) Provider:  Verdean Murin, Manxie  NP  Oneal Grout, MD  Patient Care Team: Oneal Grout, MD as PCP - General (Internal Medicine) Avionna Bower X, NP as Nurse Practitioner (Internal Medicine)  Extended Emergency Contact Information Primary Emergency Contact: [facility] Address: [address]          [place],  [address] Home Phone: [phone] Relation: None  Code Status:  Full Code Goals of care: Advanced Directive information Advanced Directives 03/19/2017  Does Patient Have a Medical Advance Directive? Yes  Type of Advance Directive Healthcare Power of Attorney  Does patient want to make changes to medical advance directive? No - Patient declined  Copy of Healthcare Power of Attorney in Chart? Yes     Chief Complaint  Patient presents with  . Medical Management of Chronic Issues    F/u- GERD, late onset of alzheimers disease.    HPI:  Pt is a 84 y.o. male seen today for medical management of chronic diseases.     The patient has history of dementia, on Razadyne 8mg  bid,  adjusted to SNF [facility] well, self transfer, toileting, ambulating. His mood is stable while on Wellbutrin, sleeps well at night while on Trazodone 50mg  daily. GERD stable on Omeprazole 40mg  qd, Carafate 1gm qid.   Past Medical History:  Diagnosis Date  . Anxiety   . Arthritis   . BPH (benign prostatic hyperplasia)   . Dementia   . GERD (gastroesophageal reflux disease)   . Hypertension   . IBS (irritable bowel syndrome)    Past Surgical History:  Procedure Laterality Date  . CHOLECYSTECTOMY    . EYE SURGERY      No Known Allergies  Outpatient Encounter Medications as of 03/19/2017  Medication Sig  . buPROPion (WELLBUTRIN) 100 MG tablet Take 1 tablet (100 mg total) by mouth daily.  . fexofenadine (ALLEGRA) 180 MG tablet Take 180 mg by mouth daily.  . Fluocinolone Acetonide 0.01 % OIL Apply topically. APPLY  TO DAMP SCALP, COVER WITH CAP, LEAVE IN OVERNIGHT, RINSE OUT USING KETOCONAZOLE SHAMPOO ON SHOWER DAYS Tues & Fri 7-3  . galantamine (RAZADYNE) 8 MG tablet Take 8 mg by mouth 2 (two) times daily.  Marland Kitchen ketoconazole (NIZORAL) 2 % shampoo Apply 1 application topically 2 (two) times a week.  Marland Kitchen omeprazole (PRILOSEC) 40 MG capsule Take 40 mg by mouth daily.   . sucralfate (CARAFATE) 1 g tablet Take 1 g by mouth 4 (four) times daily.  . traZODone (DESYREL) 50 MG tablet Take 50 mg by mouth at bedtime.   No facility-administered encounter medications on file as of 03/19/2017.    ROS was provided with assistance of staff Review of Systems  Constitutional: Negative for activity change, appetite change, chills, diaphoresis, fatigue and fever.  HENT: Positive for hearing loss, trouble swallowing and voice change. Negative for congestion.   Eyes: Negative for visual disturbance.  Respiratory: Negative for cough, choking, shortness of breath and wheezing.   Cardiovascular: Positive for leg swelling. Negative for chest pain and palpitations.  Gastrointestinal: Negative for abdominal distention, abdominal pain, constipation, diarrhea and vomiting.  Endocrine: Negative for cold intolerance.  Genitourinary: Negative for difficulty urinating, dysuria and urgency.  Musculoskeletal: Positive for gait problem. Negative for back pain and joint swelling.  Skin: Negative for color change and pallor.  Neurological: Negative for dizziness, tremors, speech difficulty and weakness.       Dementia.   Psychiatric/Behavioral: Positive  for confusion. Negative for agitation, behavioral problems, hallucinations and sleep disturbance. The patient is not nervous/anxious.     Immunization History  Administered Date(s) Administered  . Influenza, High Dose Seasonal PF 11/08/2015, 11/07/2016  . Influenza-Unspecified 10/29/2015   Pertinent  Health Maintenance Due  Topic Date Due  . PNA vac Low Risk Adult (1 of 2 - PCV13)  07/05/1997  . INFLUENZA VACCINE  Completed   No flowsheet data found. Functional Status Survey:    Vitals:   03/19/17 1353  BP: 140/80  Pulse: 79  Resp: 20  Temp: 98.4 F (36.9 C)  SpO2: 95%  Weight: 188 lb 9.6 oz (85.5 kg)  Height: 5\' 10"  (1.778 m)   Body mass index is 27.06 kg/m. Physical Exam  Constitutional: He appears well-developed and well-nourished.  HENT:  Head: Normocephalic and atraumatic.  Eyes: Conjunctivae and EOM are normal. Pupils are equal, round, and reactive to light.  Neck: Normal range of motion. Neck supple. No JVD present. No thyromegaly present.  Cardiovascular: Normal rate and regular rhythm.  No murmur heard. Pulmonary/Chest: Effort normal and breath sounds normal. He has no wheezes. He has no rales.  Abdominal: Soft. Bowel sounds are normal. He exhibits no distension. There is no tenderness.  Musculoskeletal: Normal range of motion. He exhibits edema. He exhibits no tenderness.  Trace edema BLE  Neurological: He is alert. He exhibits normal muscle tone. Coordination normal.  Oriented to self and his room on unit.   Skin: Skin is warm and dry.  Psychiatric: He has a normal mood and affect. His behavior is normal.    Labs reviewed: Recent Labs    02/20/17  NA 138  K 4.1  BUN 15  CREATININE 1.0   Recent Labs    02/20/17  AST 14  ALT 13  ALKPHOS 56   Recent Labs    02/20/17  WBC 7.5  HGB 14.0  HCT 40*  PLT 140*   No results found for: TSH Lab Results  Component Value Date   HGBA1C 5.4 02/20/2017   No results found for: CHOL, HDL, LDLCALC, LDLDIRECT, TRIG, CHOLHDL  Significant Diagnostic Results in last 30 days:  No results found.  Assessment/Plan Alzheimer's dementia with behavioral disturbance The patient has history of dementia, continue Razadyne 8mg  bid,  [facility], self transfer, toileting, ambulating.   Major depression, recurrent, chronic (HCC) His mood is stable while on Wellbutrin, will switch to  Bupropion 100mg  to 150mg  xl for daily use, sleeps well at night while on Trazodone 50mg  daily.   GERD (gastroesophageal reflux disease) GERD stable, continue Omeprazole 40mg  qd, Carafate 1gm qid.        Family/ staff Communication: plan of care reviewed with the patient and charge nurse.   Labs/tests ordered:  none  Time spend 25 minutes.

## 2017-03-21 NOTE — Assessment & Plan Note (Signed)
The patient has history of dementia, continue Razadyne 8mg  bid,  [facility], self transfer, toileting, ambulating.

## 2017-03-21 NOTE — Assessment & Plan Note (Signed)
GERD stable, continue Omeprazole 40mg  qd, Carafate 1gm qid.

## 2017-03-21 NOTE — Assessment & Plan Note (Signed)
His mood is stable while on Wellbutrin, will switch to Bupropion 100mg  to 150mg  xl for daily use, sleeps well at night while on Trazodone 50mg  daily.

## 2017-04-15 ENCOUNTER — Encounter: Payer: Self-pay | Admitting: Nurse Practitioner

## 2017-04-15 ENCOUNTER — Non-Acute Institutional Stay (SKILLED_NURSING_FACILITY): Payer: Medicare Other | Admitting: Nurse Practitioner

## 2017-04-15 DIAGNOSIS — K219 Gastro-esophageal reflux disease without esophagitis: Secondary | ICD-10-CM | POA: Diagnosis not present

## 2017-04-15 DIAGNOSIS — F0281 Dementia in other diseases classified elsewhere with behavioral disturbance: Secondary | ICD-10-CM | POA: Diagnosis not present

## 2017-04-15 DIAGNOSIS — R32 Unspecified urinary incontinence: Secondary | ICD-10-CM | POA: Diagnosis not present

## 2017-04-15 DIAGNOSIS — F339 Major depressive disorder, recurrent, unspecified: Secondary | ICD-10-CM

## 2017-04-15 DIAGNOSIS — G309 Alzheimer's disease, unspecified: Secondary | ICD-10-CM | POA: Diagnosis not present

## 2017-04-15 DIAGNOSIS — R350 Frequency of micturition: Secondary | ICD-10-CM

## 2017-04-15 NOTE — Progress Notes (Signed)
Location:  [facility] Room Number: 28 Place of Service:  SNF (31) Provider:  Mast, Manxie  NP  Oneal GroutPandey, Mahima, MD  Patient Care Team: Oneal GroutPandey, Mahima, MD as PCP - General (Internal Medicine) Mast, Man X, NP as Nurse Practitioner (Internal Medicine)  Extended Emergency Contact Information Primary Emergency Contact: [facility] Address: [address]          [place],  [id number] Home Phone: [phone] Relation: None  Code Status:  DNR Goals of care: Advanced Directive information Advanced Directives 04/15/2017  Does Patient Have a Medical Advance Directive? Yes  Type of Advance Directive Healthcare Power of Attorney  Does patient want to make changes to medical advance directive? No - Patient declined  Copy of Healthcare Power of Attorney in Chart? Yes  Pre-existing out of facility DNR order (yellow form or pink MOST form) Yellow form placed in chart (order not valid for inpatient use)     Chief Complaint  Patient presents with  . Medical Management of Chronic Issues    ? incontinent at night,     HPI:  Pt is a 84 y.o. male seen today for medical management of chronic diseases.    The patient has history of dementia, resides in SNF [facility], on Razadyne 8mg  bid for memory, last MMSE 11/30 02/06/17, self transfer and independent ambulatory. His mood is stable on Trazodone 50mg  and Wellbutrin 150mg  qd. Staff reported the patient has new onset of incontinent from last night, HPI was provided with assistance of staff, it seems he was saying urine frequency this morning 3-4x already and lower abdomen discomfort, no rash or skin breakdown in urogenital, anal, buttocks upon my examination.    Past Medical History:  Diagnosis Date  . Anxiety   . Arthritis   . BPH (benign prostatic hyperplasia)   . Dementia   . GERD (gastroesophageal reflux disease)   . Hypertension   . IBS (irritable bowel syndrome)    Past Surgical History:  Procedure Laterality Date    . CHOLECYSTECTOMY    . EYE SURGERY      No Known Allergies  Outpatient Encounter Medications as of 04/15/2017  Medication Sig  . buPROPion (WELLBUTRIN) 100 MG tablet Take 1 tablet (100 mg total) by mouth daily.  . fexofenadine (ALLEGRA) 180 MG tablet Take 180 mg by mouth daily.  Marland Kitchen. galantamine (RAZADYNE) 8 MG tablet Take 8 mg by mouth 2 (two) times daily.  Marland Kitchen. omeprazole (PRILOSEC) 40 MG capsule Take 40 mg by mouth daily.   . sucralfate (CARAFATE) 1 g tablet Take 1 g by mouth 4 (four) times daily.  . traZODone (DESYREL) 50 MG tablet Take 50 mg by mouth at bedtime.  . [DISCONTINUED] Fluocinolone Acetonide 0.01 % OIL Apply topically. APPLY TO DAMP SCALP, COVER WITH CAP, LEAVE IN OVERNIGHT, RINSE OUT USING KETOCONAZOLE SHAMPOO ON SHOWER DAYS Tues & Fri 7-3  . [DISCONTINUED] ketoconazole (NIZORAL) 2 % shampoo Apply 1 application topically 2 (two) times a week.   No facility-administered encounter medications on file as of 04/15/2017.    ROS was provided with assistance of staff Review of Systems  Constitutional: Negative for activity change, appetite change, chills, diaphoresis, fatigue and fever.  HENT: Positive for hearing loss and trouble swallowing. Negative for congestion and voice change.   Respiratory: Negative for cough, chest tightness, shortness of breath and wheezing.   Cardiovascular: Positive for leg swelling. Negative for chest pain and palpitations.  Gastrointestinal: Positive for abdominal pain. Negative for abdominal distention, constipation, diarrhea,  nausea, rectal pain and vomiting.       Suprapubic area discomfort.   Genitourinary: Positive for frequency. Negative for difficulty urinating, discharge, dysuria, hematuria, penile pain, penile swelling, scrotal swelling, testicular pain and urgency.       New onset of urinary incontinence and frequency  Musculoskeletal: Positive for gait problem.  Skin: Negative for color change, pallor, rash and wound.  Neurological:  Negative for tremors, speech difficulty, weakness and headaches.       Dementia    Immunization History  Administered Date(s) Administered  . Influenza, High Dose Seasonal PF 11/08/2015, 11/07/2016  . Influenza-Unspecified 10/29/2015   Pertinent  Health Maintenance Due  Topic Date Due  . PNA vac Low Risk Adult (1 of 2 - PCV13) 07/05/1997  . INFLUENZA VACCINE  Completed   No flowsheet data found. Functional Status Survey:    Vitals:   04/15/17 0949  BP: 132/80  Pulse: 74  Resp: 20  Temp: 98.2 F (36.8 C)  SpO2: 96%  Weight: 189 lb 6.4 oz (85.9 kg)  Height: 5\' 10"  (1.778 m)   Body mass index is 27.18 kg/m. Physical Exam  Constitutional: He appears well-developed and well-nourished. No distress.  HENT:  Head: Normocephalic and atraumatic.  Eyes: Pupils are equal, round, and reactive to light. Conjunctivae and EOM are normal.  Neck: Normal range of motion. Neck supple. No JVD present. No thyromegaly present.  Cardiovascular: Normal rate and regular rhythm.  No murmur heard. Pulmonary/Chest: Effort normal and breath sounds normal. He has no wheezes. He has no rales.  Abdominal: Soft. Bowel sounds are normal. He exhibits no distension. There is tenderness. There is no rebound and no guarding.  Suprapubic area discomfort when palpated.   Genitourinary: Penis normal. No penile tenderness.  Musculoskeletal: He exhibits edema. He exhibits no tenderness.  Trace edema BLE  Neurological: He is alert. He exhibits normal muscle tone. Coordination normal.  Oriented to self.   Skin: Skin is warm and dry. He is not diaphoretic.  Psychiatric: He has a normal mood and affect. His behavior is normal.    Labs reviewed: Recent Labs    02/20/17  NA 138  K 4.1  BUN 15  CREATININE 1.0   Recent Labs    02/20/17  AST 14  ALT 13  ALKPHOS 56   Recent Labs    02/20/17  WBC 7.5  HGB 14.0  HCT 40*  PLT 140*   No results found for: TSH Lab Results  Component Value Date    HGBA1C 5.4 02/20/2017   No results found for: CHOL, HDL, LDLCALC, LDLDIRECT, TRIG, CHOLHDL  Significant Diagnostic Results in last 30 days:  No results found.  Assessment/Plan Alzheimer's dementia with behavioral disturbance last MMSE 11/30 02/06/17, self transfer and independent ambulatory, continue Razadyne 8mg  bid po.   Major depression, recurrent, chronic (HCC) His mood is stable, continue Trazodone 50mg  and Wellbutrin 150mg  qd.  GERD (gastroesophageal reflux disease) Stable, continue Omeprazole and Carafate.   Incontinent of urine Obtain UA C/S, CBC/diff in setting of lower abd pain and urinary frequency. Continue adult depends for urinary leakage. Observe.   Urinary frequency New onset, obtain UA C/S to r/o UTI in setting of new onset urinary incontinence and suprapubic discomfort. Observe      Family/ staff Communication: plan of care reviewed with the patient and charge nurse. Prevnar 13 prescription written.   Labs/tests ordered:  CBC/diff, UA C/S,   Time spend 25 minutes

## 2017-04-15 NOTE — Assessment & Plan Note (Signed)
Obtain UA C/S, CBC/diff in setting of lower abd pain and urinary frequency. Continue adult depends for urinary leakage. Observe.

## 2017-04-15 NOTE — Assessment & Plan Note (Addendum)
last MMSE 11/30 02/06/17, self transfer and independent ambulatory, continue Razadyne 8mg  bid po.

## 2017-04-15 NOTE — Assessment & Plan Note (Signed)
New onset, obtain UA C/S to r/o UTI in setting of new onset urinary incontinence and suprapubic discomfort. Observe

## 2017-04-15 NOTE — Assessment & Plan Note (Signed)
Stable, continue Omeprazole and Carafate.

## 2017-04-15 NOTE — Assessment & Plan Note (Addendum)
His mood is stable, continue Trazodone 50mg  and Wellbutrin 150mg  qd.

## 2017-04-16 LAB — CBC AND DIFFERENTIAL
HEMATOCRIT: 40 — AB (ref 41–53)
HEMOGLOBIN: 13.7 (ref 13.5–17.5)
PLATELETS: 146 — AB (ref 150–399)
WBC: 7.4

## 2017-04-17 ENCOUNTER — Other Ambulatory Visit: Payer: Self-pay | Admitting: *Deleted

## 2017-04-18 ENCOUNTER — Non-Acute Institutional Stay (SKILLED_NURSING_FACILITY): Payer: Medicare Other

## 2017-04-18 DIAGNOSIS — Z0000 Encounter for general adult medical examination without abnormal findings: Secondary | ICD-10-CM | POA: Diagnosis not present

## 2017-04-18 NOTE — Patient Instructions (Signed)
[name] , Thank you for taking time to come for your Medicare Wellness Visit. I appreciate your ongoing commitment to your health goals. Please review the following plan we discussed and let me know if I can assist you in the future.   Screening recommendations/referrals: Colonoscopy excluded, pt is over age 82 Recommended yearly ophthalmology/optometry visit for glaucoma screening and checkup Recommended yearly dental visit for hygiene and checkup  Vaccinations: Influenza vaccine up to date, due 2019 fall season Pneumococcal vaccine 13 due, ordered Tdap vaccine due, ordered Shingles vaccine not in past records    Advanced directives: in chart  Conditions/risks identified: none  Next appointment: Dr. Glade Nichols makes rounds  Preventive Care 65 Years and Older, Male Preventive care refers to lifestyle choices and visits with your health care provider that can promote health and wellness. What does preventive care include?  A yearly physical exam. This is also called an annual well check.  Dental exams once or twice a year.  Routine eye exams. Ask your health care provider how often you should have your eyes checked.  Personal lifestyle choices, including:  Daily care of your teeth and gums.  Regular physical activity.  Eating a healthy diet.  Avoiding tobacco and drug use.  Limiting alcohol use.  Practicing safe sex.  Taking low doses of aspirin every day.  Taking vitamin and mineral supplements as recommended by your health care provider. What happens during an annual well check? The services and screenings done by your health care provider during your annual well check will depend on your age, overall health, lifestyle risk factors, and family history of disease. Counseling  Your health care provider may ask you questions about your:  Alcohol use.  Tobacco use.  Drug use.  Emotional well-being.  Home and relationship well-being.  Sexual  activity.  Eating habits.  History of falls.  Memory and ability to understand (cognition).  Work and work Astronomerenvironment. Screening  You may have the following tests or measurements:  Height, weight, and BMI.  Blood pressure.  Lipid and cholesterol levels. These may be checked every 5 years, or more frequently if you are over 82 years old.  Skin check.  Lung cancer screening. You may have this screening every year starting at age 82 if you have a 30-pack-year history of smoking and currently smoke or have quit within the past 15 years.  Fecal occult blood test (FOBT) of the stool. You may have this test every year starting at age 82.  Flexible sigmoidoscopy or colonoscopy. You may have a sigmoidoscopy every 5 years or a colonoscopy every 10 years starting at age 350.  Prostate cancer screening. Recommendations will vary depending on your family history and other risks.  Hepatitis C blood test.  Hepatitis B blood test.  Sexually transmitted disease (STD) testing.  Diabetes screening. This is done by checking your blood sugar (glucose) after you have not eaten for a while (fasting). You may have this done every 1-3 years.  Abdominal aortic aneurysm (AAA) screening. You may need this if you are a current or former smoker.  Osteoporosis. You may be screened starting at age 82 if you are at high risk. Talk with your health care provider about your test results, treatment options, and if necessary, the need for more tests. Vaccines  Your health care provider may recommend certain vaccines, such as:  Influenza vaccine. This is recommended every year.  Tetanus, diphtheria, and acellular pertussis (Tdap, Td) vaccine. You may need a Td booster  every 10 years.  Zoster vaccine. You may need this after age 22.  Pneumococcal 13-valent conjugate (PCV13) vaccine. One dose is recommended after age 14.  Pneumococcal polysaccharide (PPSV23) vaccine. One dose is recommended after age  75. Talk to your health care provider about which screenings and vaccines you need and how often you need them. This information is not intended to replace advice given to you by your health care provider. Make sure you discuss any questions you have with your health care provider. Document Released: 02/03/2015 Document Revised: 09/27/2015 Document Reviewed: 11/08/2014 Elsevier Interactive Patient Education  2017 [place] Prevention in the Home Falls can cause injuries. They can happen to people of all ages. There are many things you can do to make your home safe and to help prevent falls. What can I do on the outside of my home?  Regularly fix the edges of walkways and driveways and fix any cracks.  Remove anything that might make you trip as you walk through a door, such as a raised step or threshold.  Trim any bushes or trees on the path to your home.  Use bright outdoor lighting.  Clear any walking paths of anything that might make someone trip, such as rocks or tools.  Regularly check to see if handrails are loose or broken. Make sure that both sides of any steps have handrails.  Any raised decks and porches should have guardrails on the edges.  Have any leaves, snow, or ice cleared regularly.  Use sand or salt on walking paths during winter.  Clean up any spills in your garage right away. This includes oil or grease spills. What can I do in the bathroom?  Use night lights.  Install grab bars by the toilet and in the tub and shower. Do not use towel bars as grab bars.  Use non-skid mats or decals in the tub or shower.  If you need to sit down in the shower, use a plastic, non-slip stool.  Keep the floor dry. Clean up any water that spills on the floor as soon as it happens.  Remove soap buildup in the tub or shower regularly.  Attach bath mats securely with double-sided non-slip rug tape.  Do not have throw rugs and other things on the floor that can make  you trip. What can I do in the bedroom?  Use night lights.  Make sure that you have a light by your bed that is easy to reach.  Do not use any sheets or blankets that are too big for your bed. They should not hang down onto the floor.  Have a firm chair that has side arms. You can use this for support while you get dressed.  Do not have throw rugs and other things on the floor that can make you trip. What can I do in the kitchen?  Clean up any spills right away.  Avoid walking on wet floors.  Keep items that you use a lot in easy-to-reach places.  If you need to reach something above you, use a strong step stool that has a grab bar.  Keep electrical cords out of the way.  Do not use floor polish or wax that makes floors slippery. If you must use wax, use non-skid floor wax.  Do not have throw rugs and other things on the floor that can make you trip. What can I do with my stairs?  Do not leave any items on the stairs.  Make  sure that there are handrails on both sides of the stairs and use them. Fix handrails that are broken or loose. Make sure that handrails are as long as the stairways.  Check any carpeting to make sure that it is firmly attached to the stairs. Fix any carpet that is loose or worn.  Avoid having throw rugs at the top or bottom of the stairs. If you do have throw rugs, attach them to the floor with carpet tape.  Make sure that you have a light switch at the top of the stairs and the bottom of the stairs. If you do not have them, ask someone to add them for you. What else can I do to help prevent falls?  Wear shoes that:  Do not have high heels.  Have rubber bottoms.  Are comfortable and fit you well.  Are closed at the toe. Do not wear sandals.  If you use a stepladder:  Make sure that it is fully opened. Do not climb a closed stepladder.  Make sure that both sides of the stepladder are locked into place.  Ask someone to hold it for you, if  possible.  Clearly mark and make sure that you can see:  Any grab bars or handrails.  First and last steps.  Where the edge of each step is.  Use tools that help you move around (mobility aids) if they are needed. These include:  Canes.  Walkers.  Scooters.  Crutches.  Turn on the lights when you go into a dark area. Replace any light bulbs as soon as they burn out.  Set up your furniture so you have a clear path. Avoid moving your furniture around.  If any of your floors are uneven, fix them.  If there are any pets around you, be aware of where they are.  Review your medicines with your doctor. Some medicines can make you feel dizzy. This can increase your chance of falling. Ask your doctor what other things that you can do to help prevent falls. This information is not intended to replace advice given to you by your health care provider. Make sure you discuss any questions you have with your health care provider. Document Released: 11/03/2008 Document Revised: 06/15/2015 Document Reviewed: 02/11/2014 Elsevier Interactive Patient Education  2017 [employer].

## 2017-04-18 NOTE — Progress Notes (Signed)
Subjective:   Jose Nichols is a 84 y.o. male who presents for Medicare Annual/Subsequent preventive examination at [facility]  Last AWV-02/22/2011       Objective:    Vitals: BP 118/70 (BP Location: Left Arm, Patient Position: Sitting)   Pulse 100   Temp 97.9 F (36.6 C) (Oral)   Ht 5\' 10"  (1.778 m)   Wt 189 lb (85.7 kg)   SpO2 97%   BMI 27.12 kg/m   Body mass index is 27.12 kg/m.  Advanced Directives 04/18/2017 04/15/2017 03/19/2017 02/18/2017 07/02/2016  Does Patient Have a Medical Advance Directive? Yes Yes Yes Yes Yes  Type of Sales promotion account executive of [employer] Power of [employer] Power of Attorney Healthcare Power of Attorney  Does patient want to make changes to medical advance directive? No - Patient declined No - Patient declined No - Patient declined No - Patient declined -  Copy of Healthcare Power of Attorney in Chart? Yes Yes Yes Yes Yes  Pre-existing out of facility DNR order (yellow form or pink MOST form) - Yellow form placed in chart (order not valid for inpatient use) - - -    Tobacco Social History   Tobacco Use  Smoking Status Never Smoker  Smokeless Tobacco Never Used     Counseling given: Not Answered   Clinical Intake:  Pre-visit preparation completed: No  Pain : No/denies pain     Nutritional Risks: None Diabetes: No     Interpreter Needed?: No  Information entered by :: Tyron Russell, RN  Past Medical History:  Diagnosis Date  . Anxiety   . Arthritis   . BPH (benign prostatic hyperplasia)   . Dementia   . GERD (gastroesophageal reflux disease)   . Hypertension   . IBS (irritable bowel syndrome)    Past Surgical History:  Procedure Laterality Date  . CHOLECYSTECTOMY    . EYE SURGERY     Family History  Problem Relation Age of Onset  . Heart disease Father    Social History   Socioeconomic History  . Marital status: Married    Spouse  name: Not on file  . Number of children: Not on file  . Years of education: Not on file  . Highest education level: Not on file  Occupational History  . Not on file  Social Needs  . Financial resource strain: Not hard at all  . Food insecurity:    Worry: Never true    Inability: Never true  . Transportation needs:    Medical: No    Non-medical: No  Tobacco Use  . Smoking status: Never Smoker  . Smokeless tobacco: Never Used  Substance and Sexual Activity  . Alcohol use: No  . Drug use: No  . Sexual activity: Not on file  Lifestyle  . Physical activity:    Days per week: 2 days    Minutes per session: 30 min  . Stress: Only a little  Relationships  . Social connections:    Talks on phone: More than three times a week    Gets together: More than three times a week    Attends religious service: Never    Active member of club or organization: No    Attends meetings of clubs or organizations: Never    Relationship status: Married  Other Topics Concern  . Not on file  Social History Narrative   Do you drink/eat things with caffeine?  Marital status: Widowed, What year were you married? 1954   Do you live in a apartment? Yes    How many people live in the home? 1   Do you have pets? No   Current or past profession: Professor Psychology  - [employer]   Do you exercise? Yes, walk   Do you have a living will? Yes     Outpatient Encounter Medications as of 04/18/2017  Medication Sig  . buPROPion (WELLBUTRIN) 100 MG tablet Take 1 tablet (100 mg total) by mouth daily.  . fexofenadine (ALLEGRA) 180 MG tablet Take 180 mg by mouth daily.  Marland Kitchen galantamine (RAZADYNE) 8 MG tablet Take 8 mg by mouth 2 (two) times daily.  Marland Kitchen omeprazole (PRILOSEC) 40 MG capsule Take 40 mg by mouth daily.   . sucralfate (CARAFATE) 1 g tablet Take 1 g by mouth 4 (four) times daily.  . traZODone (DESYREL) 50 MG tablet Take 50 mg by mouth at bedtime.   No facility-administered encounter medications on  file as of 04/18/2017.     Activities of Daily Living In your present state of health, do you have any difficulty performing the following activities: 04/18/2017  Hearing? Y  Vision? Y  Difficulty concentrating or making decisions? Y  Walking or climbing stairs? Y  Dressing or bathing? Y  Doing errands, shopping? Y  Preparing Food and eating ? Y  Using the Toilet? Y  In the past six months, have you accidently leaked urine? Y  Do you have problems with loss of bowel control? Y  Managing your Medications? Y  Managing your Finances? Y  Housekeeping or managing your Housekeeping? Y  Some recent data might be hidden    Patient Care Team: Oneal Grout, MD as PCP - General (Internal Medicine) Mast, Man X, NP as Nurse Practitioner (Internal Medicine)   Assessment:   This is a routine wellness examination for [place].  Exercise Activities and Dietary recommendations Current Exercise Habits: Structured exercise class, Type of exercise: Other - see comments([facility] exercises classes), Time (Minutes): 30, Frequency (Times/Week): 2, Weekly Exercise (Minutes/Week): 60, Intensity: Mild, Exercise limited by: None identified  Goals    None      Fall Risk Fall Risk  04/18/2017  Falls in the past year? No   Is the patient's home free of loose throw rugs in walkways, pet beds, electrical cords, etc?   yes      Grab bars in the bathroom? yes      Handrails on the stairs?   yes      Adequate lighting?   yes   Depression Screen PHQ 2/9 Scores 04/18/2017  PHQ - 2 Score 0    Cognitive Function: within the last year MMSE - Mini Mental State Exam 02/10/2017  Orientation to time 1  Orientation to Place 1  Registration 3  Attention/ Calculation 0  Recall 1  Language- name 2 objects 0  Language- repeat 0  Language- follow 3 step command 3  Language- read & follow direction 1  Write a sentence 1  Copy design 0  Total score 11        Immunization History  Administered Date(s)  Administered  . Influenza, High Dose Seasonal PF 11/08/2015, 11/07/2016  . Influenza-Unspecified 10/29/2015    Qualifies for Shingles Vaccine? Not in past records  Screening Tests Health Maintenance  Topic Date Due  . TETANUS/TDAP  07/06/1949  . PNA vac Low Risk Adult (1 of 2 - PCV13) 07/05/1997  . INFLUENZA VACCINE  Completed   Cancer Screenings: Lung: Low Dose CT Chest recommended if Age 75-80 years, 30 pack-year currently smoking OR have quit w/in 15years. Patient does not qualify. Colorectal: up to date  Additional Screenings: Hepatitis C Screening: declined TDAP due, ordered PNA 13 due, ordered    Plan:    I have personally reviewed and addressed the Medicare Annual Wellness questionnaire and have noted the following in the patient's chart:  A. Medical and social history B. Use of alcohol, tobacco or illicit drugs  C. Current medications and supplements D. Functional ability and status E.  Nutritional status F.  Physical activity G. Advance directives H. List of other physicians I.  Hospitalizations, surgeries, and ER visits in previous 12 months J.  Vitals K. Screenings to include hearing, vision, cognitive, depression L. Referrals and appointments - none  In addition, I have reviewed and discussed with patient certain preventive protocols, quality metrics, and best practice recommendations. A written personalized care plan for preventive services as well as general preventive health recommendations were provided to patient.  See attached scanned questionnaire for additional information.   Signed,   Tyron RussellSara Ashleynicole Mcclees, RN Nurse Health Advisor  Patient Concerns: Lower stomach discomfort

## 2017-05-16 ENCOUNTER — Non-Acute Institutional Stay (SKILLED_NURSING_FACILITY): Payer: Medicare Other | Admitting: Internal Medicine

## 2017-05-16 ENCOUNTER — Encounter: Payer: Self-pay | Admitting: Internal Medicine

## 2017-05-16 DIAGNOSIS — F339 Major depressive disorder, recurrent, unspecified: Secondary | ICD-10-CM

## 2017-05-16 DIAGNOSIS — F0281 Dementia in other diseases classified elsewhere with behavioral disturbance: Secondary | ICD-10-CM | POA: Diagnosis not present

## 2017-05-16 DIAGNOSIS — G309 Alzheimer's disease, unspecified: Secondary | ICD-10-CM | POA: Diagnosis not present

## 2017-05-16 DIAGNOSIS — D696 Thrombocytopenia, unspecified: Secondary | ICD-10-CM | POA: Insufficient documentation

## 2017-05-16 DIAGNOSIS — K219 Gastro-esophageal reflux disease without esophagitis: Secondary | ICD-10-CM

## 2017-05-16 DIAGNOSIS — J309 Allergic rhinitis, unspecified: Secondary | ICD-10-CM

## 2017-05-16 NOTE — Progress Notes (Signed)
Location:  [facility] Room Number: 28 Place of Service:  SNF ([phone]) Provider:  Oneal GroutPandey, Jasenia Weilbacher  MD  Oneal GroutPandey, Aerik Polan, MD  Patient Care Team: Oneal GroutPandey, Jozlin Bently, MD as PCP - General (Internal Medicine) Mast, Man X, NP as Nurse Practitioner (Internal Medicine)  Extended Emergency Contact Information Primary Emergency Contact: [facility] Address: [address]          [place],  [id number] Home Phone: [phone] Relation: None  Code Status:  DNR Goals of care: Advanced Directive information Advanced Directives 05/16/2017  Does Patient Have a Medical Advance Directive? Yes  Type of Estate agentAdvance Directive Healthcare Power of [place]orney;Living will  Does patient want to make changes to medical advance directive? No - Patient declined  Copy of Healthcare Power of Attorney in Chart? Yes  Pre-existing out of facility DNR order (yellow form or pink MOST form) -     Chief Complaint  Patient presents with  . Medical Management of Chronic Issues    routine follow up    HPI:  Pt is a 84 y.o. male seen today for medical management of chronic diseases. He is pleasantly confused with his dementia and is in no acute distress. He is seen in his room today. HPI and ROS limited with dementia and difficulty expressing himself. He feeds himself and ambulates without a assistive device. No fall reported. No pressure ulcer reported.   Dementia- takes galantamine 8 mg bid  Depression- takes trazodone 50 mg daily with bupropion 100 mg daily for mood and sleep aid. Resting well at night per nursing.   GERD- takes omeprazole 40 mg daily and carafate qid with meals. Denies nausea. No vomiting reported by nursing.  Allergic rhinitis- takes allegra daily.     Past Medical History:  Diagnosis Date  . Anxiety   . Arthritis   . BPH (benign prostatic hyperplasia)   . Dementia   . GERD (gastroesophageal reflux disease)   . Hypertension   . IBS (irritable bowel syndrome)    Past  Surgical History:  Procedure Laterality Date  . CHOLECYSTECTOMY    . EYE SURGERY      No Known Allergies  Outpatient Encounter Medications as of 05/16/2017  Medication Sig  . buPROPion (WELLBUTRIN) 100 MG tablet Take 1 tablet (100 mg total) by mouth daily.  . fexofenadine (ALLEGRA) 180 MG tablet Take 180 mg by mouth daily.  Marland Kitchen. galantamine (RAZADYNE) 8 MG tablet Take 8 mg by mouth 2 (two) times daily.  Marland Kitchen. omeprazole (PRILOSEC) 40 MG capsule Take 40 mg by mouth daily.   . sucralfate (CARAFATE) 1 g tablet Take 1 g by mouth 4 (four) times daily.  . traZODone (DESYREL) 50 MG tablet Take 50 mg by mouth at bedtime.   No facility-administered encounter medications on file as of 05/16/2017.     Review of Systems  Constitutional: Negative for appetite change and fever.  HENT: Negative for congestion.   Respiratory: Negative for cough and shortness of breath.   Cardiovascular: Negative for chest pain.  Gastrointestinal: Negative for abdominal pain, nausea and vomiting.  Genitourinary:       Urinary incontinence  Musculoskeletal: Positive for gait problem. Negative for back pain.  Skin: Negative for rash.  Neurological: Negative for dizziness and headaches.  Psychiatric/Behavioral: Positive for behavioral problems and confusion.    Immunization History  Administered Date(s) Administered  . Influenza, High Dose Seasonal PF 11/08/2015, 11/07/2016  . Influenza-Unspecified 10/29/2015   Pertinent  Health Maintenance Due  Topic Date Due  .  INFLUENZA VACCINE  08/21/2017  . PNA vac Low Risk Adult (2 of 2 - PPSV23) 04/17/2018   Fall Risk  04/18/2017  Falls in the past year? No   Functional Status Survey:    Vitals:   05/16/17 1110  BP: 130/80  Pulse: 72  Resp: 20  Temp: 98.4 F (36.9 C)  Weight: 193 lb 6.4 oz (87.7 kg)  Height: 5\' 10"  (1.778 m)   Body mass index is 27.75 kg/m.   Wt Readings from Last 3 Encounters:  05/16/17 193 lb 6.4 oz (87.7 kg)  04/18/17 189 lb (85.7 kg)    04/15/17 189 lb 6.4 oz (85.9 kg)   Physical Exam  Constitutional:  Overweight elderly male in no acute distress  HENT:  Head: Normocephalic and atraumatic.  Right Ear: External ear normal.  Left Ear: External ear normal.  Nose: Nose normal.  Mouth/Throat: Oropharynx is clear and moist. No oropharyngeal exudate.  Eyes: Pupils are equal, round, and reactive to light. Conjunctivae and EOM are normal. Right eye exhibits no discharge. Left eye exhibits no discharge.  Neck: Normal range of motion. Neck supple.  Cardiovascular: Normal rate, regular rhythm and intact distal pulses.  Pulmonary/Chest: Effort normal and breath sounds normal. He has no wheezes. He has no rales.  Abdominal: Soft. Bowel sounds are normal. He exhibits no distension. There is no guarding.  Musculoskeletal: He exhibits edema.  Broad based gait, able to move all 4 extremities  Lymphadenopathy:    He has no cervical adenopathy.  Neurological: He is alert.  Oriented to self   Skin: Skin is warm and dry. No rash noted. He is not diaphoretic. No erythema.  Psychiatric:  Pleasantly confused, restless at times    Labs reviewed: Recent Labs    02/20/17  NA 138  K 4.1  BUN 15  CREATININE 1.0   Recent Labs    02/20/17  AST 14  ALT 13  ALKPHOS 56   Recent Labs    02/20/17 04/16/17  WBC 7.5 7.4  HGB 14.0 13.7  HCT 40* 40*  PLT 140* 146*   No results found for: TSH Lab Results  Component Value Date   HGBA1C 5.4 02/20/2017   No results found for: CHOL, HDL, LDLCALC, LDLDIRECT, TRIG, CHOLHDL  Significant Diagnostic Results in last 30 days:  No results found.  Assessment/Plan  1. Major depression, recurrent, chronic (HCC) Continue trazodone and bupropion, supportive care  2. Gastroesophageal reflux disease without esophagitis With hiatal hernia with paraesophageal component on barium swallow in 2005. Continue carafate and PPI  3. Alzheimer's dementia with behavioral disturbance, unspecified timing  of dementia onset Supportive care, in long term care setting, assistance with ADLs as needed, fall precautions  4. Allergic rhinitis, unspecified seasonality, unspecified trigger Continue fexofenadine  5. Thrombocytopenia (HCC) No bleed reported. Monitor cbc periodically and for signs of bleeding.     Family/ staff Communication: reviewed care plan with patient and charge nurse.    Labs/tests ordered:  none  Oneal Grout, MD  [facility] [phone] (Monday-Friday 8 am - 5 pm) [phone] (afterhours)

## 2017-06-12 ENCOUNTER — Encounter: Payer: Self-pay | Admitting: Nurse Practitioner

## 2017-06-12 ENCOUNTER — Non-Acute Institutional Stay (SKILLED_NURSING_FACILITY): Payer: Medicare Other | Admitting: Nurse Practitioner

## 2017-06-12 DIAGNOSIS — F339 Major depressive disorder, recurrent, unspecified: Secondary | ICD-10-CM | POA: Diagnosis not present

## 2017-06-12 DIAGNOSIS — K219 Gastro-esophageal reflux disease without esophagitis: Secondary | ICD-10-CM

## 2017-06-12 DIAGNOSIS — R269 Unspecified abnormalities of gait and mobility: Secondary | ICD-10-CM

## 2017-06-12 DIAGNOSIS — G309 Alzheimer's disease, unspecified: Secondary | ICD-10-CM

## 2017-06-12 DIAGNOSIS — F0281 Dementia in other diseases classified elsewhere with behavioral disturbance: Secondary | ICD-10-CM | POA: Diagnosis not present

## 2017-06-12 NOTE — Assessment & Plan Note (Signed)
GERD stable, continue Omeprazole, dc Carafate. Observe.

## 2017-06-12 NOTE — Progress Notes (Signed)
Location:  [facility] Room Number: 28 Place of Service:  SNF (31) Provider:  Mast, ManXie  NP  Oneal Grout, MD  Patient Care Team: Oneal Grout, MD as PCP - General (Internal Medicine) Mast, Man X, NP as Nurse Practitioner (Internal Medicine)  Extended Emergency Contact Information Primary Emergency Contact: [facility] Address: [address]          [place],  [address] Home Phone: [phone] Relation: None  Code Status:  DNR Goals of care: Advanced Directive information Advanced Directives 06/12/2017  Does Patient Have a Medical Advance Directive? Yes  Type of Advance Directive Healthcare Power of Attorney  Does patient want to make changes to medical advance directive? No - Patient declined  Copy of Healthcare Power of Attorney in Chart? Yes  Pre-existing out of facility DNR order (yellow form or pink MOST form) -     Chief Complaint  Patient presents with  . Medical Management of Chronic Issues    F/U - depression, Alzheimers, GERD    HPI:  Pt is a 84 y.o. male seen today for medical management of chronic diseases.     The patient has history of dementia, resides in SNF [facility], takes Razadyne  bid for memory, self transfer, unsteady on feet. His mood is stable on Bupropion  qd, Trazodone  hs. GERD stable on Omeprazole and Carafate.  Past Medical History:  Diagnosis Date  . Anxiety   . Arthritis   . BPH (benign prostatic hyperplasia)   . Dementia   . GERD (gastroesophageal reflux disease)   . Hypertension   . IBS (irritable bowel syndrome)    Past Surgical History:  Procedure Laterality Date  . CHOLECYSTECTOMY    . EYE SURGERY      No Known Allergies  Outpatient Encounter Medications as of 06/12/2017  Medication Sig  . buPROPion (WELLBUTRIN) 100 MG tablet Take 1 tablet (100 mg total) by mouth daily.  . fexofenadine (ALLEGRA) 180 MG tablet Take 180 mg by mouth daily.  Marland Kitchen galantamine (RAZADYNE) 8 MG tablet Take 8  mg by mouth 2 (two) times daily.  Marland Kitchen omeprazole (PRILOSEC) 40 MG capsule Take 40 mg by mouth daily.   . sucralfate (CARAFATE) 1 g tablet Take 1 g by mouth 4 (four) times daily.  . traZODone (DESYREL) 50 MG tablet Take 50 mg by mouth at bedtime.   No facility-administered encounter medications on file as of 06/12/2017.    ROS was provided with assistance of staff Review of Systems  Constitutional: Negative for activity change, appetite change, chills, diaphoresis, fatigue and fever.  HENT: Positive for hearing loss. Negative for congestion and voice change.   Respiratory: Negative for cough, shortness of breath and wheezing.   Cardiovascular: Negative for chest pain, palpitations and leg swelling.  Gastrointestinal: Negative for abdominal distention, abdominal pain, constipation, diarrhea, nausea and vomiting.  Genitourinary: Negative for difficulty urinating, dysuria and urgency.       Incontinent of urine.   Musculoskeletal: Positive for gait problem.  Skin: Negative for color change and pallor.  Neurological: Negative for dizziness, speech difficulty, weakness and headaches.       Dementia  Psychiatric/Behavioral: Positive for confusion. Negative for agitation, behavioral problems, hallucinations and sleep disturbance. The patient is nervous/anxious.        Restless sometimes.     Immunization History  Administered Date(s) Administered  . Influenza, High Dose Seasonal PF 11/08/2015, 11/07/2016  . Influenza-Unspecified 10/29/2015   Pertinent  Health Maintenance Due  Topic Date Due  .  INFLUENZA VACCINE  08/21/2017  . PNA vac Low Risk Adult (2 of 2 - PPSV23) 04/17/2018   Fall Risk  04/18/2017  Falls in the past year? No   Functional Status Survey:    Vitals:   06/12/17 0841  BP: 130/70  Pulse: 70  Resp: 20  Temp: 98 F (36.7 C)  SpO2: 96%  Weight: 193 lb 12.8 oz (87.9 kg)  Height:  (1.778 m)   Body mass index is 27.81 kg/m. Physical Exam  Constitutional: He  appears well-developed and well-nourished.  HENT:  Head: Normocephalic and atraumatic.  Eyes: Pupils are equal, round, and reactive to light. EOM are normal.  Neck: Normal range of motion. Neck supple. No JVD present. No thyromegaly present.  Cardiovascular: Normal rate and regular rhythm.  No murmur heard. Pulmonary/Chest: Effort normal and breath sounds normal. He has no wheezes. He has no rales.  Abdominal: Soft. Bowel sounds are normal. He exhibits no distension. There is no tenderness. There is no guarding.  Musculoskeletal: Normal range of motion. He exhibits no edema or tenderness.  Wobbly gait, furniture walk.   Neurological: He is alert.  Oriented to self and his room on unit.   Skin: Skin is warm and dry.  Psychiatric: He has a normal mood and affect. His behavior is normal.    Labs reviewed: Recent Labs    02/20/17  NA 138  K 4.1  BUN 15  CREATININE 1.0   Recent Labs    02/20/17  AST 14  ALT 13  ALKPHOS 56   Recent Labs    02/20/17 04/16/17  WBC 7.5 7.4  HGB 14.0 13.7  HCT 40* 40*  PLT 140* 146*   No results found for: TSH Lab Results  Component Value Date   HGBA1C 5.4 02/20/2017   No results found for: CHOL, HDL, LDLCALC, LDLDIRECT, TRIG, CHOLHDL  Significant Diagnostic Results in last 30 days:  No results found.  Assessment/Plan Gait disorder dementia, resides in SNF [facility], takes Razadyne  bid for memory, self transfer, unsteady on feet. PT to evaluate, treat as indicated, ? recommendation for assistive device-walker   GERD (gastroesophageal reflux disease) GERD stable, continue Omeprazole, dc Carafate. Observe.    Alzheimer's dementia with behavioral disturbance dementia, resides in SNF [facility], continue  Razadyne  bid for memory, self transfer, unsteady on feet. Update CBC CMP TSH  Major depression, recurrent, chronic (HCC)  His mood is stable, continue  Bupropion  qd, Trazodone  hs.      Family/ staff Communication: plan of  care reviewed with the patient and charge nurse.   Labs/tests ordered: CBC, CMP, TSH   Time spend 25 minutes.

## 2017-06-12 NOTE — Assessment & Plan Note (Signed)
dementia, resides in SNF [facility], takes Razadyne  bid for memory, self transfer, unsteady on feet. PT to evaluate, treat as indicated, ? recommendation for assistive device-walker

## 2017-06-12 NOTE — Assessment & Plan Note (Signed)
His mood is stable, continue  Bupropion  qd, Trazodone  hs.

## 2017-06-12 NOTE — Assessment & Plan Note (Addendum)
dementia, resides in SNF [facility], continue  Razadyne  bid for memory, self transfer, unsteady on feet. Update CBC CMP TSH

## 2017-06-13 ENCOUNTER — Encounter: Payer: Self-pay | Admitting: Nurse Practitioner

## 2017-06-17 ENCOUNTER — Non-Acute Institutional Stay (SKILLED_NURSING_FACILITY): Payer: Medicare Other | Admitting: Nurse Practitioner

## 2017-06-17 ENCOUNTER — Encounter: Payer: Self-pay | Admitting: Nurse Practitioner

## 2017-06-17 DIAGNOSIS — G309 Alzheimer's disease, unspecified: Secondary | ICD-10-CM

## 2017-06-17 DIAGNOSIS — H9190 Unspecified hearing loss, unspecified ear: Secondary | ICD-10-CM | POA: Diagnosis not present

## 2017-06-17 DIAGNOSIS — H02402 Unspecified ptosis of left eyelid: Secondary | ICD-10-CM | POA: Diagnosis not present

## 2017-06-17 DIAGNOSIS — I10 Essential (primary) hypertension: Secondary | ICD-10-CM

## 2017-06-17 DIAGNOSIS — R4701 Aphasia: Secondary | ICD-10-CM

## 2017-06-17 DIAGNOSIS — F0281 Dementia in other diseases classified elsewhere with behavioral disturbance: Secondary | ICD-10-CM | POA: Diagnosis not present

## 2017-06-17 LAB — HEPATIC FUNCTION PANEL
ALT: 12 (ref 10–40)
AST: 12 — AB (ref 14–40)
Alkaline Phosphatase: 88 (ref 25–125)
Bilirubin, Total: 1.3

## 2017-06-17 LAB — TSH: TSH: 1.89 (ref <=5.90)

## 2017-06-17 LAB — BASIC METABOLIC PANEL
BUN: 18 (ref 4–21)
Creatinine: 1.2 (ref <=1.3)
GLUCOSE: 96
POTASSIUM: 4.2 (ref 3.4–5.3)
Sodium: 141 (ref 137–147)

## 2017-06-17 LAB — CBC AND DIFFERENTIAL
HCT: 45 (ref 41–53)
Hemoglobin: 15.5 (ref 13.5–17.5)
Platelets: 157 (ref 150–399)
WBC: 8.5

## 2017-06-17 NOTE — Assessment & Plan Note (Addendum)
left eye drooping 06/16/17 reported, no apparent drooping of the left eye, he denied pain or change of vision. HPI was provided with assistance of staff due to his dementia and expressive aphasia. Continue SNF [facility] for care assistance, continue Razadyne for memory

## 2017-06-17 NOTE — Assessment & Plan Note (Signed)
Possible etiology is progression of dementia, continue to monitor for focal neurological symptoms.

## 2017-06-17 NOTE — Progress Notes (Addendum)
Location:  [facility] Room Number: 28 Place of Service:  SNF (31) Provider:  Luka Stohr, ManXie  NP  Oneal Grout, MD  Patient Care Team: Oneal Grout, MD as PCP - General (Internal Medicine) Demontre Padin X, NP as Nurse Practitioner (Internal Medicine)  Extended Emergency Contact Information Primary Emergency Contact: [facility] Address: [address]          [place],  [address] Home Phone: [phone] Relation: None  Code Status:  Full Code Goals of care: Advanced Directive information Advanced Directives 06/12/2017  Does Patient Have a Medical Advance Directive? Yes  Type of Advance Directive Healthcare Power of Attorney  Does patient want to make changes to medical advance directive? No - Patient declined  Copy of Healthcare Power of Attorney in Chart? Yes  Pre-existing out of facility DNR order (yellow form or pink MOST form) -     Chief Complaint  Patient presents with  . Acute Visit    (L) eye dropping    HPI:  Pt is a 84 y.o. male seen today for an acute visit for reported left eye drooping 06/16/17, no apparent drooping of the left eye, he denied pain or change of vision. HPI was provided with assistance of staff due to his dementia and expressive aphagia.    Past Medical History:  Diagnosis Date  . Anxiety   . Arthritis   . BPH (benign prostatic hyperplasia)   . Dementia   . GERD (gastroesophageal reflux disease)   . Hypertension   . IBS (irritable bowel syndrome)    Past Surgical History:  Procedure Laterality Date  . CHOLECYSTECTOMY    . EYE SURGERY      No Known Allergies  Outpatient Encounter Medications as of 06/17/2017  Medication Sig  . buPROPion (WELLBUTRIN) 100 MG tablet Take 1 tablet (100 mg total) by mouth daily.  . fexofenadine (ALLEGRA) 180 MG tablet Take 180 mg by mouth daily.  Marland Kitchen galantamine (RAZADYNE) 8 MG tablet Take 8 mg by mouth 2 (two) times daily.  Marland Kitchen omeprazole (PRILOSEC) 40 MG capsule Take 40 mg by  mouth daily.   . traZODone (DESYREL) 50 MG tablet Take 50 mg by mouth at bedtime.  . [DISCONTINUED] sucralfate (CARAFATE) 1 g tablet Take 1 g by mouth 4 (four) times daily.   No facility-administered encounter medications on file as of 06/17/2017.    ROS was provided with assistance of staff Review of Systems  Constitutional: Negative for activity change, appetite change, chills, diaphoresis, fatigue and fever.  HENT: Positive for hearing loss. Negative for congestion and voice change.   Eyes: Negative for photophobia, pain, discharge, redness, itching and visual disturbance.  Respiratory: Negative for cough and shortness of breath.   Cardiovascular: Negative for chest pain and leg swelling.  Musculoskeletal: Positive for gait problem.  Neurological: Positive for speech difficulty. Negative for tremors, facial asymmetry, weakness, light-headedness and headaches.       Dementia  Psychiatric/Behavioral: Positive for confusion. Negative for agitation, behavioral problems, hallucinations and sleep disturbance. The patient is nervous/anxious.     Immunization History  Administered Date(s) Administered  . Influenza, High Dose Seasonal PF 11/08/2015, 11/07/2016  . Influenza-Unspecified 10/29/2015   Pertinent  Health Maintenance Due  Topic Date Due  . INFLUENZA VACCINE  08/21/2017  . PNA vac Low Risk Adult (2 of 2 - PPSV23) 04/17/2018   Fall Risk  04/18/2017  Falls in the past year? No   Functional Status Survey:    Vitals:   06/17/17 1320  BP: 122/78  Pulse: 78  Resp: 18  Temp: 97.8 F (36.6 C)  SpO2: 95%  Weight: 193 lb 12.8 oz (87.9 kg)  Height:  (1.778 m)   Body mass index is 27.81 kg/m. Physical Exam  Constitutional: He appears well-developed and well-nourished. No distress.  HENT:  Head: Normocephalic and atraumatic.  Eyes: Pupils are equal, round, and reactive to light. Conjunctivae and EOM are normal. Right eye exhibits no discharge. Left eye exhibits no  discharge. No scleral icterus.  Neck: Normal range of motion. Neck supple. No JVD present. No thyromegaly present.  Cardiovascular: Normal rate and regular rhythm.  No murmur heard. Pulmonary/Chest: He has no wheezes.  Abdominal: Soft. Bowel sounds are normal.  Musculoskeletal: He exhibits no edema.  Ambulates with furniture or walker.   Neurological: He is alert. No cranial nerve deficit. He exhibits normal muscle tone. Coordination normal.  Oriented to self and his room on unit.   Skin: Skin is warm and dry. He is not diaphoretic.  Psychiatric: He has a normal mood and affect.    Labs reviewed: Recent Labs    02/20/17 06/17/17  NA 138 141  K 4.1 4.2  BUN 15 18  CREATININE 1.0 1.2  CALCIUM  --  9.4   Recent Labs    02/20/17 06/17/17  AST 14 12*  ALT 13 12  ALKPHOS 56 88  ALBUMIN  --  4.1   Recent Labs    02/20/17 04/16/17 06/17/17  WBC 7.5 7.4 8.5  HGB 14.0 13.7 15.5  HCT 40* 40* 45  PLT 140* 146* 157   Lab Results  Component Value Date   TSH 1.89 06/17/2017   Lab Results  Component Value Date   HGBA1C 5.4 02/20/2017   No results found for: CHOL, HDL, LDLCALC, LDLDIRECT, TRIG, CHOLHDL  Significant Diagnostic Results in last 30 days:  No results found.  Assessment/Plan Alzheimer's dementia with behavioral disturbance left eye drooping 06/16/17 reported, no apparent drooping of the left eye, he denied pain or change of vision. HPI was provided with assistance of staff due to his dementia and expressive aphasia. Continue SNF [facility] for care assistance, continue Razadyne for memory    Expressive aphasia Possible etiology is progression of dementia, continue to monitor for focal neurological symptoms.   HOH (hard of hearing) Audiology to evaluate and treat.   Blepharoptosis, left The patient's son stated the patient had trouble closing his eye in the past, ophthalmology put "weight" in the left eyelid to close the eye. Now the patient complaining the left upper  eyelid drooping sometimes that he has to use his finger to push it up. Will recommend to f/u Ophthalmology. Observe.       Family/ staff Communication: plan of care reviewed with the patient and charge nurse.   Labs/tests ordered:  none  Time spend 25 minutes.

## 2017-06-18 DIAGNOSIS — H02402 Unspecified ptosis of left eyelid: Secondary | ICD-10-CM | POA: Insufficient documentation

## 2017-06-18 DIAGNOSIS — H9190 Unspecified hearing loss, unspecified ear: Secondary | ICD-10-CM | POA: Insufficient documentation

## 2017-06-18 LAB — COMPREHENSIVE METABOLIC PANEL
Albumin: 4.1
CALCIUM: 9.4
GFR CALC NON AF AMER: 58
Globulin: 2.6
PROTEIN: 6.7

## 2017-06-18 NOTE — Assessment & Plan Note (Signed)
Audiology to evaluate and treat.

## 2017-06-18 NOTE — Assessment & Plan Note (Signed)
The patient's son stated the patient had trouble closing his eye in the past, ophthalmology put "weight" in the left eyelid to close the eye. Now the patient complaining the left upper eyelid drooping sometimes that he has to use his finger to push it up. Will recommend to f/u Ophthalmology. Observe.

## 2017-07-09 ENCOUNTER — Non-Acute Institutional Stay (SKILLED_NURSING_FACILITY): Payer: Medicare Other | Admitting: Nurse Practitioner

## 2017-07-09 ENCOUNTER — Encounter: Payer: Self-pay | Admitting: Nurse Practitioner

## 2017-07-09 DIAGNOSIS — G309 Alzheimer's disease, unspecified: Secondary | ICD-10-CM | POA: Diagnosis not present

## 2017-07-09 DIAGNOSIS — R1314 Dysphagia, pharyngoesophageal phase: Secondary | ICD-10-CM | POA: Diagnosis not present

## 2017-07-09 DIAGNOSIS — M549 Dorsalgia, unspecified: Secondary | ICD-10-CM

## 2017-07-09 DIAGNOSIS — F0281 Dementia in other diseases classified elsewhere with behavioral disturbance: Secondary | ICD-10-CM

## 2017-07-09 DIAGNOSIS — R4701 Aphasia: Secondary | ICD-10-CM | POA: Diagnosis not present

## 2017-07-09 DIAGNOSIS — F5101 Primary insomnia: Secondary | ICD-10-CM

## 2017-07-09 DIAGNOSIS — M79605 Pain in left leg: Secondary | ICD-10-CM

## 2017-07-09 DIAGNOSIS — F339 Major depressive disorder, recurrent, unspecified: Secondary | ICD-10-CM

## 2017-07-09 DIAGNOSIS — K219 Gastro-esophageal reflux disease without esophagitis: Secondary | ICD-10-CM | POA: Diagnosis not present

## 2017-07-09 NOTE — Progress Notes (Addendum)
Location:  [facility] Room Number: 28 Place of Service:  SNF (31) Provider:  Reginald Weida,ManXie  NP  Oneal Grout, MD  Patient Care Team: Oneal Grout, MD as PCP - General (Internal Medicine) Firmin Belisle X, NP as Nurse Practitioner (Internal Medicine)  Extended Emergency Contact Information Primary Emergency Contact: [facility] Address: [address]          [place],  [address] Home Phone: [phone] Relation: None  Code Status:  DNR Goals of care: Advanced Directive information Advanced Directives 07/09/2017  Does Patient Have a Medical Advance Directive? Yes  Type of Estate agent of [place];Out of facility DNR (pink MOST or yellow form)  Does patient want to make changes to medical advance directive? No - Patient declined  Copy of Healthcare Power of Attorney in Chart? Yes  Pre-existing out of facility DNR order (yellow form or pink MOST form) Yellow form placed in chart (order not valid for inpatient use)     Chief Complaint  Patient presents with  . Medical Management of Chronic Issues    F/U- Alzheimers dementia, gait disorder, HTN    HPI:  Pt is a 84 y.o. male seen today for medical management of chronic diseases.     The patient has history dementia, resides in SN[facility], pleasant, difficulty voicing his needs, on Razadyne 8mg  bid for memory, occasional loose stools noted. His mood is stable, taking Trazodone 50mg  qhs, Wllbutrin 100mg  daily. GERD is stable on Omeprazole. There is left lower back pain reported by staff and Tylenol helped in the past.  Past Medical History:  Diagnosis Date  . Anxiety   . Arthritis   . BPH (benign prostatic hyperplasia)   . Dementia   . GERD (gastroesophageal reflux disease)   . Hypertension   . IBS (irritable bowel syndrome)    Past Surgical History:  Procedure Laterality Date  . CHOLECYSTECTOMY    . EYE SURGERY      No Known Allergies  Outpatient Encounter Medications as of  07/09/2017  Medication Sig  . buPROPion (WELLBUTRIN) 100 MG tablet Take 1 tablet (100 mg total) by mouth daily.  . fexofenadine (ALLEGRA) 180 MG tablet Take 180 mg by mouth daily.  Marland Kitchen galantamine (RAZADYNE) 8 MG tablet Take 8 mg by mouth 2 (two) times daily.  Marland Kitchen omeprazole (PRILOSEC) 40 MG capsule Take 40 mg by mouth daily.   . traZODone (DESYREL) 50 MG tablet Take 50 mg by mouth at bedtime.   No facility-administered encounter medications on file as of 07/09/2017.     Review of Systems  Constitutional: Negative for activity change, appetite change, chills, diaphoresis and fatigue.  HENT: Positive for hearing loss. Negative for congestion, trouble swallowing and voice change.   Respiratory: Negative for cough, shortness of breath and wheezing.   Cardiovascular: Positive for leg swelling. Negative for chest pain and palpitations.  Gastrointestinal: Positive for diarrhea. Negative for abdominal distention, abdominal pain, constipation, nausea and vomiting.       Loose stools occasionally.   Genitourinary: Negative for difficulty urinating, dysuria and urgency.       Incontinent urine   Musculoskeletal: Positive for arthralgias, back pain and gait problem.  Skin: Negative for color change and pallor.  Neurological: Positive for speech difficulty. Negative for tremors, facial asymmetry, weakness and headaches.       Dementia  Psychiatric/Behavioral: Positive for confusion. Negative for agitation, behavioral problems, hallucinations and sleep disturbance. The patient is not nervous/anxious.     Immunization History  Administered Date(s) Administered  . Influenza, High Dose Seasonal PF 11/08/2015, 11/07/2016  . Influenza-Unspecified 10/29/2015   Pertinent  Health Maintenance Due  Topic Date Due  . INFLUENZA VACCINE  08/21/2017  . PNA vac Low Risk Adult (2 of 2 - PPSV23) 04/17/2018   Fall Risk  04/18/2017  Falls in the past year? No   Functional Status Survey:    Vitals:   07/09/17  1247  BP: 120/64  Pulse: 66  Resp: 18  Temp: 97.8 F (36.6 C)  SpO2: 95%  Weight: 190 lb 11.2 oz (86.5 kg)  Height: 5\' 10"  (1.778 m)   Body mass index is 27.36 kg/m. Physical Exam  Constitutional: He appears well-developed and well-nourished.  HENT:  Head: Normocephalic and atraumatic.  Eyes: Pupils are equal, round, and reactive to light. EOM are normal.  Neck: Normal range of motion. Neck supple. No JVD present. No thyromegaly present.  Cardiovascular: Normal rate and regular rhythm.  No murmur heard. Pulmonary/Chest: Effort normal and breath sounds normal. He has no wheezes. He has no rales.  Abdominal: Soft. Bowel sounds are normal. He exhibits no distension. There is no tenderness. There is no rebound and no guarding.  Musculoskeletal: He exhibits edema and tenderness.  Ambulates with walker. Left lower back and leg pain per staff/patient. Trace edema BLE  Neurological: He is alert.  Oriented to self and his room on unit.   Skin: Skin is warm and dry.  Psychiatric: He has a normal mood and affect. His behavior is normal.    Labs reviewed: Recent Labs    02/20/17 06/17/17  NA 138 141  K 4.1 4.2  BUN 15 18  CREATININE 1.0 1.2  CALCIUM  --  9.4   Recent Labs    02/20/17 06/17/17  AST 14 12*  ALT 13 12  ALKPHOS 56 88  ALBUMIN  --  4.1   Recent Labs    02/20/17 04/16/17 06/17/17  WBC 7.5 7.4 8.5  HGB 14.0 13.7 15.5  HCT 40* 40* 45  PLT 140* 146* 157   Lab Results  Component Value Date   TSH 1.89 06/17/2017   Lab Results  Component Value Date   HGBA1C 5.4 02/20/2017   No results found for: CHOL, HDL, LDLCALC, LDLDIRECT, TRIG, CHOLHDL  Significant Diagnostic Results in last 30 days:  No results found.  Assessment/Plan GERD (gastroesophageal reflux disease) Stable, continue Omeprazole 20mg  po daily.   Alzheimer's dementia with behavioral disturbance Continue SNF [facility] for care needs, he is self transfer, ambulates with walker or furniture, risk for  falling since his safety awareness is limited and he is unsteady in feet. Close supervision for safety. Continue Razadyne 8mg  bid.   Major depression, recurrent, chronic (HCC) His mood is stable, continue  Wellbutrin daily. Decrease Trazodone to 25mg /50mg  po qhs. Observe for return of targeted symptoms.   Expressive aphasia Persist, continue SNF [facility] for care needs.   Dysphagia Improved, thin liquid  Pain of back and left lower extremity Will have prn Tylenol 650mg  q8h prn available to him. Continue to ambulate with walker  Insomnia Sleeps well at night, GDR per pharm recommendation, reduce Trazodone to 25mg  po qhs. Observe for return of targeted symptoms.      Family/ staff Communication: plan of care reviewed with the patient and charge nurse.   Labs/tests ordered:  none  Time spend 25 minutes.

## 2017-07-09 NOTE — Assessment & Plan Note (Signed)
Will have prn Tylenol 650mg  q8h prn available to him. Continue to ambulate with walker

## 2017-07-09 NOTE — Assessment & Plan Note (Signed)
Improved, thin liquid

## 2017-07-09 NOTE — Assessment & Plan Note (Signed)
Continue SNF [facility] for care needs, he is self transfer, ambulates with walker or furniture, risk for falling since his safety awareness is limited and he is unsteady in feet. Close supervision for safety. Continue Razadyne 8mg  bid.

## 2017-07-09 NOTE — Assessment & Plan Note (Signed)
Persist, continue SNF [facility] for care needs.

## 2017-07-09 NOTE — Assessment & Plan Note (Addendum)
His mood is stable, continue  Wellbutrin daily. Decrease Trazodone to 25mg /50mg  po qhs. Observe for return of targeted symptoms.

## 2017-07-09 NOTE — Assessment & Plan Note (Signed)
Stable, continue Omeprazole 20mg po daily.  

## 2017-07-10 ENCOUNTER — Encounter: Payer: Self-pay | Admitting: Nurse Practitioner

## 2017-07-10 NOTE — Assessment & Plan Note (Signed)
Sleeps well at night, GDR per pharm recommendation, reduce Trazodone to 25mg  po qhs. Observe for return of targeted symptoms.

## 2017-08-01 ENCOUNTER — Encounter: Payer: Self-pay | Admitting: Internal Medicine

## 2017-08-01 ENCOUNTER — Non-Acute Institutional Stay (SKILLED_NURSING_FACILITY): Payer: Medicare Other | Admitting: Internal Medicine

## 2017-08-01 DIAGNOSIS — F339 Major depressive disorder, recurrent, unspecified: Secondary | ICD-10-CM

## 2017-08-01 DIAGNOSIS — J309 Allergic rhinitis, unspecified: Secondary | ICD-10-CM

## 2017-08-01 DIAGNOSIS — G309 Alzheimer's disease, unspecified: Secondary | ICD-10-CM

## 2017-08-01 DIAGNOSIS — R4701 Aphasia: Secondary | ICD-10-CM | POA: Diagnosis not present

## 2017-08-01 DIAGNOSIS — F0281 Dementia in other diseases classified elsewhere with behavioral disturbance: Secondary | ICD-10-CM

## 2017-08-01 DIAGNOSIS — K219 Gastro-esophageal reflux disease without esophagitis: Secondary | ICD-10-CM

## 2017-08-01 NOTE — Progress Notes (Signed)
Location:  [facility] Room Number: 28 Place of Service:  SNF [phone](31) Provider:  Oneal GroutMahima Gerhard Rappaport MD  Oneal GroutPandey, Isahia Hollerbach, MD  Patient Care Team: Oneal GroutPandey, Davey Limas, MD as PCP - General (Internal Medicine) Mast, Man X, NP as Nurse Practitioner (Internal Medicine)  Extended Emergency Contact Information [facility] Address: [address]          [place],  [id number] Home Phone: [phone] Relation: None  Code Status:  Full code  Goals of care: Advanced Directive information Advanced Directives 08/01/2017  Does Patient Have a Medical Advance Directive? Yes  Type of Advance Directive Healthcare Power of Attorney  Does patient want to make changes to medical advance directive? No - Patient declined  Copy of Healthcare Power of Attorney in Chart? Yes  Pre-existing out of facility DNR order (yellow form or pink MOST form) -     Chief Complaint  Patient presents with  . Medical Management of Chronic Issues    Routine Visit     HPI:  Pt is a 84 y.o. male seen today for medical management of chronic diseases.  He is seen in his room today. He denies any concern. He is pleasantly confused. He takes his medications. He ambulates with walker for most part but sometimes without assistive device. No fall reported. He denies any reflux symptom. Resting well at night. Allergy symptoms are controlled. Mood overall stable per nursing with increase in dosing of wellbutrin.    Past Medical History:  Diagnosis Date  . Anxiety   . Arthritis   . BPH (benign prostatic hyperplasia)   . Dementia   . GERD (gastroesophageal reflux disease)   . Hypertension   . IBS (irritable bowel syndrome)    Past Surgical History:  Procedure Laterality Date  . CHOLECYSTECTOMY    . EYE SURGERY      No Known Allergies  Outpatient Encounter Medications as of 08/01/2017  Medication Sig  . acetaminophen (TYLENOL) 325 MG tablet Take 650 mg by mouth every 8 (eight)  hours as needed.  Marland Kitchen. buPROPion (WELLBUTRIN XL) 150 MG 24 hr tablet Take 150 mg by mouth daily.  . fexofenadine (ALLEGRA) 180 MG tablet Take 180 mg by mouth daily.  Marland Kitchen. galantamine (RAZADYNE) 8 MG tablet Take 8 mg by mouth 2 (two) times daily.  Marland Kitchen. omeprazole (PRILOSEC) 40 MG capsule Take 40 mg by mouth daily.   . traZODone (DESYREL) 50 MG tablet Take 25 mg by mouth at bedtime.   . [DISCONTINUED] buPROPion (WELLBUTRIN) 100 MG tablet Take 1 tablet (100 mg total) by mouth daily.   No facility-administered encounter medications on file as of 08/01/2017.     Review of Systems  Constitutional: Negative for appetite change, chills and fever.  HENT: Negative for congestion, hearing loss, postnasal drip and trouble swallowing.   Respiratory: Negative for cough and shortness of breath.   Cardiovascular: Positive for leg swelling. Negative for chest pain and palpitations.  Gastrointestinal: Negative for abdominal pain, blood in stool, constipation, diarrhea, nausea and vomiting.  Genitourinary: Negative for decreased urine volume, dysuria and hematuria.  Musculoskeletal: Negative for arthralgias and back pain.  Skin: Negative for rash and wound.  Neurological: Positive for speech difficulty. Negative for dizziness and light-headedness.  Psychiatric/Behavioral: Positive for confusion. Negative for behavioral problems.    Immunization History  Administered Date(s) Administered  . Influenza, High Dose Seasonal PF 11/08/2015, 11/07/2016  . Influenza-Unspecified 10/29/2015   Pertinent  Health Maintenance Due  Topic Date Due  . INFLUENZA  VACCINE  08/21/2017  . PNA vac Low Risk Adult (2 of 2 - PPSV23) 04/17/2018   Fall Risk  04/18/2017  Falls in the past year? No   Functional Status Survey:    Vitals:   08/01/17 1410  BP: 138/76  Pulse: 84  Resp: 18  Temp: 98.6 F (37 C)  TempSrc: Oral  SpO2: 95%  Weight: 190 lb 6.4 oz (86.4 kg)  Height: 5\' 8"  (1.727 m)   Body mass index is 28.95 kg/m.    Wt Readings from Last 3 Encounters:  08/01/17 190 lb 6.4 oz (86.4 kg)  07/09/17 190 lb 11.2 oz (86.5 kg)  06/17/17 193 lb 12.8 oz (87.9 kg)   Physical Exam  Constitutional:  Overweight elderly male in no acute distress  HENT:  Head: Normocephalic and atraumatic.  Right Ear: External ear normal.  Left Ear: External ear normal.  Mouth/Throat: Oropharynx is clear and moist.  Eyes: Pupils are equal, round, and reactive to light. Conjunctivae and EOM are normal. Right eye exhibits no discharge. Left eye exhibits no discharge.  Neck: Normal range of motion. Neck supple.  Cardiovascular: Normal rate, regular rhythm and intact distal pulses.  Pulmonary/Chest: Effort normal and breath sounds normal. No respiratory distress. He has no wheezes.  Abdominal: Soft. Bowel sounds are normal. There is no tenderness. There is no guarding.  Musculoskeletal: He exhibits edema. He exhibits no tenderness.  Trace leg edema, stasis changes to both legs, can move all 4 extremities, unsteady gait and uses walker  Lymphadenopathy:    He has no cervical adenopathy.  Neurological: He is alert.  Oriented to self, pleasantly confused  Skin: Skin is warm and dry. He is not diaphoretic.  Psychiatric: He has a normal mood and affect.    Labs reviewed: Recent Labs    02/20/17 06/17/17  NA 138 141  K 4.1 4.2  BUN 15 18  CREATININE 1.0 1.2  CALCIUM  --  9.4   Recent Labs    02/20/17 06/17/17  AST 14 12*  ALT 13 12  ALKPHOS 56 88  ALBUMIN  --  4.1   Recent Labs    02/20/17 04/16/17 06/17/17  WBC 7.5 7.4 8.5  HGB 14.0 13.7 15.5  HCT 40* 40* 45  PLT 140* 146* 157   Lab Results  Component Value Date   TSH 1.89 06/17/2017   Lab Results  Component Value Date   HGBA1C 5.4 02/20/2017   No results found for: CHOL, HDL, LDLCALC, LDLDIRECT, TRIG, CHOLHDL  Significant Diagnostic Results in last 30 days:  No results found.  Assessment/Plan  1. Gastroesophageal reflux disease without  esophagitis Continue omeprazole 40 mg daily and monitor  2. Allergic rhinitis, unspecified seasonality, unspecified trigger C/w allegra and monitor  3. Alzheimer's dementia with behavioral disturbance, unspecified timing of dementia onset Continue razadyne, supportive care, antidepressant and monitor  4. Major depression, recurrent, chronic (HCC) Continue wellbutrin 150 mg daily and trazodone 25 mg daily  5. Expressive aphasia Supportive care   Family/ staff Communication: reviewed care plan with patient and charge nurse.    Labs/tests ordered:  none   Oneal Grout, MD Internal Medicine [facility] [address] [place], [place] [address] Cell Phone (Monday-Friday 8 am - 5 pm): [phone] On Call: [phone] and follow prompts after 5 pm and on weekends Office Phone: [phone] Office Fax: [phone]

## 2017-09-01 ENCOUNTER — Encounter: Payer: Self-pay | Admitting: Nurse Practitioner

## 2017-09-01 ENCOUNTER — Non-Acute Institutional Stay (SKILLED_NURSING_FACILITY): Payer: Medicare Other | Admitting: Nurse Practitioner

## 2017-09-01 DIAGNOSIS — R269 Unspecified abnormalities of gait and mobility: Secondary | ICD-10-CM

## 2017-09-01 DIAGNOSIS — G309 Alzheimer's disease, unspecified: Secondary | ICD-10-CM

## 2017-09-01 DIAGNOSIS — M549 Dorsalgia, unspecified: Secondary | ICD-10-CM

## 2017-09-01 DIAGNOSIS — F0281 Dementia in other diseases classified elsewhere with behavioral disturbance: Secondary | ICD-10-CM

## 2017-09-01 DIAGNOSIS — F339 Major depressive disorder, recurrent, unspecified: Secondary | ICD-10-CM

## 2017-09-01 DIAGNOSIS — K219 Gastro-esophageal reflux disease without esophagitis: Secondary | ICD-10-CM

## 2017-09-01 DIAGNOSIS — M79605 Pain in left leg: Secondary | ICD-10-CM

## 2017-09-01 NOTE — Assessment & Plan Note (Signed)
Stable, continue Omeprazole 20mg qd.  

## 2017-09-01 NOTE — Assessment & Plan Note (Signed)
Mood is stable, continue Bupropion 150mg  qd, Trazodone 25mg  qd.

## 2017-09-01 NOTE — Progress Notes (Addendum)
Location:  [facility] Room Number: 28 Place of Service:  SNF (31) Provider:  Alexsys Eskin, ManXie  NP  Oneal GroutPandey, Mahima, MD  Patient Care Team: Oneal GroutPandey, Mahima, MD as PCP - General (Internal Medicine) Calhoun Reichardt X, NP as Nurse Practitioner (Internal Medicine)  Extended Emergency Contact Information Primary Emergency Contact: [facility] Address: [address]          [place],  [id number] Home Phone: [phone] Relation: None  Code Status:  DNR Goals of care: Advanced Directive information Advanced Directives 09/01/2017  Does Patient Have a Medical Advance Directive? Yes  Type of Advance Directive Healthcare Power of Attorney  Does patient want to make changes to medical advance directive? No - Patient declined  Copy of Healthcare Power of Attorney in Chart? Yes  Pre-existing out of facility DNR order (yellow form or pink MOST form) Yellow form placed in chart (order not valid for inpatient use)     Chief Complaint  Patient presents with  . Medical Management of Chronic Issues    F/u- alzheimers, dementia, GERD, depression    HPI:  Pt is a 84 y.o. male seen today for medical management of chronic diseases.     The patient has history of dementia, resides in SNF [facility], self transfer, ambulates with walker, taking Galantamine 8mg  bid for memory. His mood is stable, on Trazodone 25mg  qhs, Bupropion 150mg  daily. Hx of GERD stable on Omeprazole 20mg  qd. 09/12/17 Tylenol 500mg  daily-prn used frequently for back/leg pain Past Medical History:  Diagnosis Date  . Anxiety   . Arthritis   . BPH (benign prostatic hyperplasia)   . Dementia   . GERD (gastroesophageal reflux disease)   . Hypertension   . IBS (irritable bowel syndrome)    Past Surgical History:  Procedure Laterality Date  . CHOLECYSTECTOMY    . EYE SURGERY      No Known Allergies  Outpatient Encounter Medications as of 09/01/2017  Medication Sig  . acetaminophen (TYLENOL) 325 MG tablet Take 650  mg by mouth every 8 (eight) hours as needed.  Marland Kitchen. buPROPion (WELLBUTRIN XL) 150 MG 24 hr tablet Take 150 mg by mouth daily.  . fexofenadine (ALLEGRA) 180 MG tablet Take 180 mg by mouth daily.  Marland Kitchen. galantamine (RAZADYNE) 8 MG tablet Take 8 mg by mouth 2 (two) times daily with a meal.   . omeprazole (PRILOSEC) 40 MG capsule Take 40 mg by mouth daily.   . traZODone (DESYREL) 50 MG tablet Take 25 mg by mouth at bedtime.    No facility-administered encounter medications on file as of 09/01/2017.    ROS was provided with assistance of staff Review of Systems  Constitutional: Negative for activity change, appetite change, chills, diaphoresis, fatigue and fever.  HENT: Positive for hearing loss. Negative for congestion and voice change.   Respiratory: Negative for cough, shortness of breath and wheezing.   Cardiovascular: Positive for leg swelling.  Gastrointestinal: Negative for abdominal distention, abdominal pain, constipation, diarrhea and vomiting.  Genitourinary: Negative for difficulty urinating, dysuria and urgency.       Urinary leakage.   Musculoskeletal: Positive for arthralgias, back pain and gait problem.  Skin: Negative for color change.  Neurological: Positive for speech difficulty. Negative for dizziness, weakness and headaches.       Dementia. Expressive aphasia.   Psychiatric/Behavioral: Negative for agitation, behavioral problems, hallucinations and sleep disturbance. The patient is not nervous/anxious.     Immunization History  Administered Date(s) Administered  . Influenza, High Dose Seasonal PF  11/08/2015, 11/07/2016  . Influenza-Unspecified 01/21/2014, 10/29/2015  . Pneumococcal Conjugate-13 04/16/2017  . Pneumococcal Polysaccharide-23 01/21/2014   Pertinent  Health Maintenance Due  Topic Date Due  . INFLUENZA VACCINE  08/21/2017  . PNA vac Low Risk Adult  Completed   Fall Risk  04/18/2017  Falls in the past year? No   Functional Status Survey:    Vitals:    09/01/17 1151  BP: 130/80  Pulse: 82  Resp: 20  Temp: 98.4 F (36.9 C)  SpO2: 95%  Weight: 192 lb 8 oz (87.3 kg)  Height: 5\' 8"  (1.727 m)   Body mass index is 29.27 kg/m. Physical Exam  Constitutional: He appears well-developed and well-nourished.  HENT:  Head: Normocephalic and atraumatic.  Eyes: Pupils are equal, round, and reactive to light. EOM are normal.  Neck: Normal range of motion. Neck supple. No JVD present. No thyromegaly present.  Cardiovascular: Normal rate and regular rhythm.  No murmur heard. Pulmonary/Chest: Effort normal and breath sounds normal. No respiratory distress.  Abdominal: Soft. Bowel sounds are normal.  Musculoskeletal: He exhibits edema.  Trace edema BLE  Neurological: He is alert. No cranial nerve deficit. He exhibits normal muscle tone. Coordination normal.  Oriented to person and his room   Skin: Skin is warm and dry.  Psychiatric: He has a normal mood and affect. His behavior is normal.    Labs reviewed: Recent Labs    02/20/17 06/17/17  NA 138 141  K 4.1 4.2  BUN 15 18  CREATININE 1.0 1.2  CALCIUM  --  9.4   Recent Labs    02/20/17 06/17/17  AST 14 12*  ALT 13 12  ALKPHOS 56 88  ALBUMIN  --  4.1   Recent Labs    02/20/17 04/16/17 06/17/17  WBC 7.5 7.4 8.5  HGB 14.0 13.7 15.5  HCT 40* 40* 45  PLT 140* 146* 157   Lab Results  Component Value Date   TSH 1.89 06/17/2017   Lab Results  Component Value Date   HGBA1C 5.4 02/20/2017   No results found for: CHOL, HDL, LDLCALC, LDLDIRECT, TRIG, CHOLHDL  Significant Diagnostic Results in last 30 days:  No results found.  Assessment/Plan GERD (gastroesophageal reflux disease) Stable, continue Omeprazole 20mg  qd.   Alzheimer's dementia with behavioral disturbance Continue SNF [facility] for care assistance, continue Galantamine 8mg  bid.   Major depression, recurrent, chronic (HCC) Mood is stable, continue Bupropion 150mg  qd, Trazodone 25mg  qd.   Gait disorder Continue to  ambulate with walker, needs supervision for safety due to hid dementia.   Pain of back and left lower extremity 09/12/17 Tylenol 500mg  daily-prn used frequently for back/leg pain     Family/ staff Communication: plan of care reviewed with the patient and charge nurse.   Labs/tests ordered:  none  Time spend 25 minutes.

## 2017-09-01 NOTE — Assessment & Plan Note (Signed)
Continue to ambulate with walker, needs supervision for safety due to hid dementia.

## 2017-09-01 NOTE — Assessment & Plan Note (Signed)
Continue SNF [facility] for care assistance, continue Galantamine 8mg  bid.

## 2017-09-03 ENCOUNTER — Encounter: Payer: Self-pay | Admitting: Internal Medicine

## 2017-09-12 NOTE — Assessment & Plan Note (Signed)
09/12/17 Tylenol 500mg  daily-prn used frequently for back/leg pain

## 2017-09-29 ENCOUNTER — Encounter: Payer: Self-pay | Admitting: Nurse Practitioner

## 2017-09-29 ENCOUNTER — Non-Acute Institutional Stay (SKILLED_NURSING_FACILITY): Payer: Medicare Other | Admitting: Nurse Practitioner

## 2017-09-29 DIAGNOSIS — F339 Major depressive disorder, recurrent, unspecified: Secondary | ICD-10-CM

## 2017-09-29 DIAGNOSIS — F0281 Dementia in other diseases classified elsewhere with behavioral disturbance: Secondary | ICD-10-CM

## 2017-09-29 DIAGNOSIS — F5101 Primary insomnia: Secondary | ICD-10-CM | POA: Diagnosis not present

## 2017-09-29 DIAGNOSIS — M79605 Pain in left leg: Secondary | ICD-10-CM

## 2017-09-29 DIAGNOSIS — K219 Gastro-esophageal reflux disease without esophagitis: Secondary | ICD-10-CM

## 2017-09-29 DIAGNOSIS — G309 Alzheimer's disease, unspecified: Secondary | ICD-10-CM | POA: Diagnosis not present

## 2017-09-29 DIAGNOSIS — M549 Dorsalgia, unspecified: Secondary | ICD-10-CM

## 2017-09-29 NOTE — Assessment & Plan Note (Signed)
Stable, continue Trazodone 25mg qhs.  

## 2017-09-29 NOTE — Assessment & Plan Note (Signed)
Stable, continue Omeprazole 40mg qd.  

## 2017-09-29 NOTE — Assessment & Plan Note (Signed)
Controlled, continue Tylenol 500mg  qd sicne 09/12/17

## 2017-09-29 NOTE — Progress Notes (Signed)
Location:  [facility] Room Number: 28 Place of Service:  SNF (31) Provider:  Cuthbert Turton, ManXie  NP  Oneal Grout, MD  Patient Care Team: Oneal Grout, MD as PCP - General (Internal Medicine) Geniene List X, NP as Nurse Practitioner (Internal Medicine)  Extended Emergency Contact Information Primary Emergency Contact: [facility] Address: [address]          [place],  [address] Home Phone: [phone] Relation: None  Code Status: DNR Goals of care: Advanced Directive information Advanced Directives 09/29/2017  Does Patient Have a Medical Advance Directive? Yes  Type of Advance Directive Healthcare Power of Attorney  Does patient want to make changes to medical advance directive? No - Patient declined  Copy of Healthcare Power of Attorney in Chart? Yes  Pre-existing out of facility DNR order (yellow form or pink MOST form) -     Chief Complaint  Patient presents with  . Medical Management of Chronic Issues    F/u-Gerd, HTN, depression, gait disorder,     HPI:  Pt is a 84 y.o. male seen today for medical management of chronic diseases.     The patient has history for dementia, resides in SNF [facility] for safety and care assistance, on Galantamine 8mg  bid po. His mood is stable, sleeps well, weights are stable, on Trazodone 25mg  qhs, Bupropion 150mg  qd. GERD stable on Omeprazole 40mg  qd. No complaining of arthritic pain while on Tylenol 500mg  qd.   Past Medical History:  Diagnosis Date  . Anxiety   . Arthritis   . BPH (benign prostatic hyperplasia)   . Dementia   . GERD (gastroesophageal reflux disease)   . Hypertension   . IBS (irritable bowel syndrome)    Past Surgical History:  Procedure Laterality Date  . CHOLECYSTECTOMY    . EYE SURGERY      No Known Allergies  Outpatient Encounter Medications as of 09/29/2017  Medication Sig  . acetaminophen (TYLENOL) 325 MG tablet Take 650 mg by mouth every 8 (eight) hours as needed.  Marland Kitchen acetaminophen  (TYLENOL) 500 MG tablet Take 500 mg by mouth daily.  Marland Kitchen buPROPion (WELLBUTRIN XL) 150 MG 24 hr tablet Take 150 mg by mouth daily.  . fexofenadine (ALLEGRA) 180 MG tablet Take 180 mg by mouth daily.  Marland Kitchen galantamine (RAZADYNE) 8 MG tablet Take 8 mg by mouth 2 (two) times daily with a meal.   . omeprazole (PRILOSEC) 40 MG capsule Take 40 mg by mouth daily.   . traZODone (DESYREL) 50 MG tablet Take 25 mg by mouth at bedtime.    No facility-administered encounter medications on file as of 09/29/2017.    ROS was provided with assistance of staff Review of Systems  Constitutional: Negative for activity change, appetite change, chills, diaphoresis, fatigue, fever and unexpected weight change.  HENT: Positive for hearing loss. Negative for congestion and voice change.   Respiratory: Negative for cough, shortness of breath and wheezing.   Cardiovascular: Positive for leg swelling. Negative for chest pain and palpitations.  Gastrointestinal: Negative for abdominal distention, abdominal pain, constipation, diarrhea, nausea and vomiting.  Genitourinary: Negative for difficulty urinating, dysuria and urgency.       Incontinent of urine.   Musculoskeletal: Positive for arthralgias, back pain and gait problem.  Skin: Negative for color change and pallor.  Neurological: Positive for speech difficulty. Negative for dizziness, weakness and headaches.       Dementia, expressive aphasia.   Psychiatric/Behavioral: Negative for agitation, behavioral problems, hallucinations and sleep disturbance. The  patient is not nervous/anxious.     Immunization History  Administered Date(s) Administered  . Influenza, High Dose Seasonal PF 11/08/2015, 11/07/2016  . Influenza-Unspecified 01/21/2014, 10/29/2015  . Pneumococcal Conjugate-13 04/16/2017  . Pneumococcal Polysaccharide-23 01/21/2014   Pertinent  Health Maintenance Due  Topic Date Due  . INFLUENZA VACCINE  08/21/2017  . PNA vac Low Risk Adult  Completed   Fall  Risk  04/18/2017  Falls in the past year? No   Functional Status Survey:    Vitals:   09/29/17 1247  BP: 140/70  Pulse: 84  Resp: 18  Temp: (!) 97.3 F (36.3 C)  SpO2: 96%  Weight: 191 lb 12.8 oz (87 kg)  Height: 5\' 8"  (1.727 m)   Body mass index is 29.16 kg/m. Physical Exam  Constitutional: He appears well-developed and well-nourished.  HENT:  Head: Normocephalic and atraumatic.  Eyes: Pupils are equal, round, and reactive to light. EOM are normal.  Neck: Normal range of motion. Neck supple. No JVD present. No thyromegaly present.  Cardiovascular: Normal rate and regular rhythm.  No murmur heard. Pulmonary/Chest: Effort normal and breath sounds normal. No respiratory distress. He has no wheezes. He has no rales.  Abdominal: Soft. Bowel sounds are normal. He exhibits no distension. There is no tenderness. There is no rebound and no guarding.  Musculoskeletal: He exhibits edema.  Ambulates with walker, trace edema BLE  Neurological: He is alert. No cranial nerve deficit. He exhibits normal muscle tone. Coordination normal.  Oriented to person and his room on unit.   Skin: Skin is warm and dry.  Psychiatric: He has a normal mood and affect. His behavior is normal.    Labs reviewed: Recent Labs    02/20/17 06/17/17  NA 138 141  K 4.1 4.2  BUN 15 18  CREATININE 1.0 1.2  CALCIUM  --  9.4   Recent Labs    02/20/17 06/17/17  AST 14 12*  ALT 13 12  ALKPHOS 56 88  ALBUMIN  --  4.1   Recent Labs    02/20/17 04/16/17 06/17/17  WBC 7.5 7.4 8.5  HGB 14.0 13.7 15.5  HCT 40* 40* 45  PLT 140* 146* 157   Lab Results  Component Value Date   TSH 1.89 06/17/2017   Lab Results  Component Value Date   HGBA1C 5.4 02/20/2017   No results found for: CHOL, HDL, LDLCALC, LDLDIRECT, TRIG, CHOLHDL  Significant Diagnostic Results in last 30 days:  No results found.  Assessment/Plan GERD (gastroesophageal reflux disease) Stable, continue Omeprazole 40mg  qd.   Alzheimer's  dementia with behavioral disturbance Continue SNF [facility] for safety and care assistance, continue Galantamine 8mg  bid for memory.   Major depression, recurrent, chronic (HCC) Mood is stable, GDR-change  Bupropion 150mg  qod x 10 days, then dc, continueTrazodone 25mg  qhs. Observe for return of targeted symptoms.   Insomnia Stable, continue Trazodone 25mg  qhs.   Pain of back and left lower extremity Controlled, continue Tylenol 500mg  qd sicne 09/12/17     Family/ staff Communication: plan of care reviewed with the patient and charge nurse.   Labs/tests ordered:  none  Time spend 25 minutes.

## 2017-09-29 NOTE — Assessment & Plan Note (Addendum)
Mood is stable, GDR-change  Bupropion 150mg  qod x 10 days, then dc, continueTrazodone 25mg  qhs. Observe for return of targeted symptoms.

## 2017-09-29 NOTE — Assessment & Plan Note (Signed)
Continue SNF [facility] for safety and care assistance, continue Galantamine 8mg bid for memory.  

## 2017-10-29 ENCOUNTER — Non-Acute Institutional Stay (SKILLED_NURSING_FACILITY): Payer: Medicare Other | Admitting: Family Medicine

## 2017-10-29 ENCOUNTER — Encounter: Payer: Self-pay | Admitting: Family Medicine

## 2017-10-29 DIAGNOSIS — I10 Essential (primary) hypertension: Secondary | ICD-10-CM

## 2017-10-29 DIAGNOSIS — G309 Alzheimer's disease, unspecified: Secondary | ICD-10-CM

## 2017-10-29 DIAGNOSIS — F5101 Primary insomnia: Secondary | ICD-10-CM | POA: Diagnosis not present

## 2017-10-29 DIAGNOSIS — D696 Thrombocytopenia, unspecified: Secondary | ICD-10-CM

## 2017-10-29 DIAGNOSIS — K219 Gastro-esophageal reflux disease without esophagitis: Secondary | ICD-10-CM

## 2017-10-29 DIAGNOSIS — F0281 Dementia in other diseases classified elsewhere with behavioral disturbance: Secondary | ICD-10-CM

## 2017-10-29 NOTE — Progress Notes (Signed)
Location:  [facility] Room Number: 28 Place of Service:  SNF (31) Provider:  Chipper Oman  NP  Mast, Man X, NP  Patient Care Team: Mast, Man X, NP as PCP - General (Internal Medicine) Mast, Man X, NP as Nurse Practitioner (Internal Medicine)  Extended Emergency Contact Information Primary Emergency Contact: [facility] Address: [address]          [place],  [address] Home Phone: [phone] Relation: None  Code Status:  Full Code Goals of care: Advanced Directive information Advanced Directives 10/29/2017  Does Patient Have a Medical Advance Directive? Yes  Type of Advance Directive Healthcare Power of Attorney  Does patient want to make changes to medical advance directive? No - Patient declined  Copy of Healthcare Power of Attorney in Chart? Yes  Pre-existing out of facility DNR order (yellow form or pink MOST form) -     Chief Complaint  Patient presents with  . Medical Management of Chronic Issues    F/u-Alzheimers, GERD, depression, insomnia,dementia     HPI:  Pt is a 84 y.o. [place] seen today for medical management of chronic diseases.  Problems today include Alzheimer's disease insomnia, gastroesophageal reflux.  He has a diagnosis of a aphasia but his speech is totally clear and understandable. I found him walking down the hall with his walker and we performed the examination in his room.  He requires some assistance with activities of daily living.  There are reports of intermittent confusion, but today he seemed more confused than lucid.  His speech was very rambling.  He could be refocused.  He spends a lot of time talking about money issues.  He was unable to turn on his TV by himself.  He also tells me that he would like to die and not live here.   Past Medical History:  Diagnosis Date  . Anxiety   . Arthritis   . BPH (benign prostatic hyperplasia)   . Dementia (HCC)   . GERD (gastroesophageal reflux disease)   . Hypertension     . IBS (irritable bowel syndrome)    Past Surgical History:  Procedure Laterality Date  . CHOLECYSTECTOMY    . EYE SURGERY      No Known Allergies  Outpatient Encounter Medications as of 10/29/2017  Medication Sig  . acetaminophen (TYLENOL) 325 MG tablet Take 650 mg by mouth every 8 (eight) hours as needed.  Marland Kitchen acetaminophen (TYLENOL) 500 MG tablet Take 500 mg by mouth daily.  . fexofenadine (ALLEGRA) 180 MG tablet Take 180 mg by mouth daily.  Marland Kitchen galantamine (RAZADYNE) 8 MG tablet Take 8 mg by mouth 2 (two) times daily with a meal.   . omeprazole (PRILOSEC) 40 MG capsule Take 40 mg by mouth daily.   . traZODone (DESYREL) 50 MG tablet Take 25 mg by mouth at bedtime.   . [DISCONTINUED] buPROPion (WELLBUTRIN XL) 150 MG 24 hr tablet Take 150 mg by mouth daily.   No facility-administered encounter medications on file as of 10/29/2017.     Review of Systems  Constitutional: Negative.   HENT: Negative.   Eyes: Negative.   Respiratory: Negative.   Cardiovascular: Negative.   Gastrointestinal: Negative.   Musculoskeletal: Positive for arthralgias.  Skin: Negative.   Neurological: Negative.   Hematological: Negative.   Psychiatric/Behavioral: Positive for confusion. Negative for behavioral problems.    Immunization History  Administered Date(s) Administered  . Influenza, High Dose Seasonal PF 11/08/2015, 11/07/2016  . Influenza-Unspecified 01/21/2014, 10/29/2015  . Pneumococcal  Conjugate-13 04/16/2017  . Pneumococcal Polysaccharide-23 01/21/2014   Pertinent  Health Maintenance Due  Topic Date Due  . INFLUENZA VACCINE  08/21/2017  . PNA vac Low Risk Adult  Completed   Fall Risk  04/18/2017  Falls in the past year? No   Functional Status Survey:    Vitals:   10/29/17 1005  BP: 130/80  Pulse: 72  Resp: 20  Temp: 98.3 F (36.8 C)  SpO2: 95%  Weight: 194 lb 6.4 oz (88.2 kg)  Height: 5\' 8"  (1.727 m)   Body mass index is 29.56 kg/m. Physical Exam  Constitutional: He  appears well-developed and well-nourished.  Patient is alert and conversant but rambling  HENT:  Head: Normocephalic.  Mouth/Throat: Oropharynx is clear and moist.  Eyes: Pupils are equal, round, and reactive to light.  Neck: Normal range of motion.  Cardiovascular: Normal rate, regular rhythm and normal heart sounds.  Pulmonary/Chest: Effort normal and breath sounds normal.  Abdominal: Soft. Bowel sounds are normal.  Musculoskeletal: Normal range of motion.  There is some pain in his left shoulder but range of motion is adequate  Neurological: He is alert.  Oriented only to person but not place or time  Skin: Skin is warm.  Psychiatric: He has a normal mood and affect. His behavior is normal.  Thought content is rambling.  He can focus but has to be frequently redirected to a specific question.  He is able to follow orders easily.  Nursing note and vitals reviewed.   Labs reviewed: Recent Labs    02/20/17 06/17/17  NA 138 141  K 4.1 4.2  BUN 15 18  CREATININE 1.0 1.2  CALCIUM  --  9.4   Recent Labs    02/20/17 06/17/17  AST 14 12*  ALT 13 12  ALKPHOS 56 88  ALBUMIN  --  4.1   Recent Labs    02/20/17 04/16/17 06/17/17  WBC 7.5 7.4 8.5  HGB 14.0 13.7 15.5  HCT 40* 40* 45  PLT 140* 146* 157   Lab Results  Component Value Date   TSH 1.89 06/17/2017   Lab Results  Component Value Date   HGBA1C 5.4 02/20/2017   No results found for: CHOL, HDL, LDLCALC, LDLDIRECT, TRIG, CHOLHDL  Significant Diagnostic Results in last 30 days:  No results found.  Assessment/Plan 1. Essential hypertension Blood pressures have been well controlled on combination losartan and hydrochlorothiazide.  Will continue same  2. Gastroesophageal reflux disease without esophagitis He continues to take Prilosec 40 mg a day for reflux symptoms and has no current complaints.  3. Alzheimer's dementia with behavioral disturbance, unspecified timing of dementia onset (HCC) He definitely has  cognitive issues but is high functioning at this point in time.  He continues to take galantamine  4. Primary insomnia He reports sleeping well but I think he may be taking too much sedative hypnotic.  Medicines include trazodone 25 mg at bedtime as well as Restoril 30 mg at bedtime.  I would like to discontinue the Restoril but he requests no changes.  5. Thrombocytopenia (HCC) Last platelet count done in May, 5 months ago showed a level over 150,000.  We will continue to follow, but there are no signs of thrombocytopenia clinically.   Family/ staff Communication:  Labs/tests ordered:

## 2017-11-10 ENCOUNTER — Encounter: Payer: Self-pay | Admitting: Nurse Practitioner

## 2017-11-10 ENCOUNTER — Non-Acute Institutional Stay (SKILLED_NURSING_FACILITY): Payer: Medicare Other | Admitting: Nurse Practitioner

## 2017-11-10 DIAGNOSIS — F0281 Dementia in other diseases classified elsewhere with behavioral disturbance: Secondary | ICD-10-CM | POA: Diagnosis not present

## 2017-11-10 DIAGNOSIS — F339 Major depressive disorder, recurrent, unspecified: Secondary | ICD-10-CM

## 2017-11-10 DIAGNOSIS — G309 Alzheimer's disease, unspecified: Secondary | ICD-10-CM

## 2017-11-10 NOTE — Assessment & Plan Note (Addendum)
Relapsed, will resume Wellbutrin XL 150mg  po qod x 10 days, then daily, continue Trazodone 25mg  po qhs. Will update CBC/diff, CMP.  Observe.

## 2017-11-10 NOTE — Assessment & Plan Note (Signed)
Continue SNF [facility] for safety and care assistance, continue Galantamine 8mg  bid for memory, supportive measures.

## 2017-11-10 NOTE — Progress Notes (Signed)
.  Location:  [facility] Room Number: 26 Place of Service:  SNF (31) Provider:  Arhaan Chesnut, Manxie  NP  Akirah Storck X, NP  Patient Care Team: Terrius Gentile X, NP as PCP - General (Internal Medicine) Kassius Battiste X, NP as Nurse Practitioner (Internal Medicine)  Extended Emergency Contact Information Primary Emergency Contact: [facility] Address: [address]          [place],  [address] Home Phone: [phone] Relation: None  Code Status:  Full Code Goals of care: Advanced Directive information Advanced Directives 10/29/2017  Does Patient Have a Medical Advance Directive? Yes  Type of Advance Directive Healthcare Power of Attorney  Does patient want to make changes to medical advance directive? No - Patient declined  Copy of Healthcare Power of Attorney in Chart? Yes  Pre-existing out of facility DNR order (yellow form or pink MOST form) -     Chief Complaint  Patient presents with  . Acute Visit    Depression and crying    HPI:  Pt is a 84 y.o. male seen today for an acute visit for family/son reported the patient's irritability, agitation, wanting to move, negative statements 11/06/17, staff observed the patient becoming upset at dinner if he doesn't like what he has chosen, anxious if he doesn't get what he asked for right away, staff reported the patient's crying episode and stated he feels like none of his family cares/he doesn't want live like this, pacing back and force in hallway. 11/08/17 HPOA son reported the patient has been depressed. 11/10/17 during today's visit, the patient c/o his daughter spending millions dollars of his and he doesn't get use any of it, he doesn't want to live life like this, he has sad facial looks. Hx of dementia, depression, Wellbutrin XL 150mg  tapered off 10/07/17. He also takes Trazodone 25mg  qhs, Galantamine 8mg  bid.    Past Medical History:  Diagnosis Date  . Anxiety   . Arthritis   . BPH (benign prostatic hyperplasia)    . Dementia (HCC)   . GERD (gastroesophageal reflux disease)   . Hypertension   . IBS (irritable bowel syndrome)    Past Surgical History:  Procedure Laterality Date  . CHOLECYSTECTOMY    . EYE SURGERY      No Known Allergies  Outpatient Encounter Medications as of 11/10/2017  Medication Sig  . acetaminophen (TYLENOL) 325 MG tablet Take 650 mg by mouth every 8 (eight) hours as needed.  Marland Kitchen acetaminophen (TYLENOL) 500 MG tablet Take 500 mg by mouth daily.  . fexofenadine (ALLEGRA) 180 MG tablet Take 180 mg by mouth daily.  Marland Kitchen galantamine (RAZADYNE) 8 MG tablet Take 8 mg by mouth 2 (two) times daily with a meal.   . loperamide (IMODIUM A-D) 2 MG tablet Take 2 mg by mouth as needed for diarrhea or loose stools. Check rectum for retained stool. If >3 loose stools in 24 hrs., hold all lax. and stool softeners. Give Imodium AD (loperamide) 4 mg po 1st dose, then 2 mg po after each loose stool X 48 hrs. Not to exceed 16mg  in 24 hrs. If not effective, notify MD  . omeprazole (PRILOSEC) 40 MG capsule Take 40 mg by mouth daily.   . traZODone (DESYREL) 50 MG tablet Take 25 mg by mouth at bedtime.    No facility-administered encounter medications on file as of 11/10/2017.    ROS was provided with assistance of staff Review of Systems  Constitutional: Negative for activity change, appetite change,  chills, diaphoresis, fatigue and fever.  HENT: Positive for hearing loss. Negative for voice change.   Respiratory: Negative for cough, shortness of breath and wheezing.   Cardiovascular: Positive for leg swelling. Negative for chest pain and palpitations.  Gastrointestinal: Negative for abdominal distention, abdominal pain, constipation, diarrhea, nausea and vomiting.  Genitourinary: Negative for difficulty urinating, dysuria and urgency.       Incontinent of urine  Musculoskeletal: Positive for arthralgias.  Skin: Negative for color change and pallor.  Neurological: Negative for dizziness, speech  difficulty, weakness and headaches.       Dementia, expressive aphasia.   Psychiatric/Behavioral: Positive for agitation, behavioral problems, confusion, dysphoric mood and sleep disturbance. Negative for hallucinations. The patient is nervous/anxious.     Immunization History  Administered Date(s) Administered  . Influenza, High Dose Seasonal PF 11/08/2015, 11/07/2016  . Influenza-Unspecified 01/21/2014, 10/29/2015  . Pneumococcal Conjugate-13 04/16/2017  . Pneumococcal Polysaccharide-23 01/21/2014   Pertinent  Health Maintenance Due  Topic Date Due  . INFLUENZA VACCINE  08/21/2017  . PNA vac Low Risk Adult  Completed   Fall Risk  04/18/2017  Falls in the past year? No   Functional Status Survey:    Vitals:   11/10/17 1038  BP: 130/80  Pulse: 82  Resp: 20  Temp: 98.4 F (36.9 C)  SpO2: 95%  Weight: 194 lb 6.4 oz (88.2 kg)  Height: 5\' 8"  (1.727 m)   Body mass index is 29.56 kg/m. Physical Exam  Constitutional: He appears well-developed and well-nourished.  HENT:  Head: Normocephalic and atraumatic.  Eyes: Pupils are equal, round, and reactive to light. EOM are normal.  Neck: Normal range of motion. Neck supple. No JVD present. No thyromegaly present.  Cardiovascular: Normal rate and regular rhythm.  No murmur heard. Pulmonary/Chest: Effort normal. He has no wheezes. He has no rales.  Abdominal: Soft. He exhibits no distension. There is no tenderness. There is no rebound and no guarding.  Musculoskeletal: He exhibits edema.  Trace edema BLE is chronic. Chronic left shoulder pain with full ROM. Ambulates with walker.   Neurological: He is alert. No cranial nerve deficit. He exhibits normal muscle tone. Coordination normal.  Oriented to person  Skin: Skin is warm and dry.  Psychiatric:  Sad facial looks. Rambling during conversation. Follows simple directions.     Labs reviewed: Recent Labs    02/20/17 06/17/17  NA 138 141  K 4.1 4.2  BUN 15 18  CREATININE 1.0  1.2  CALCIUM  --  9.4   Recent Labs    02/20/17 06/17/17  AST 14 12*  ALT 13 12  ALKPHOS 56 88  ALBUMIN  --  4.1   Recent Labs    02/20/17 04/16/17 06/17/17  WBC 7.5 7.4 8.5  HGB 14.0 13.7 15.5  HCT 40* 40* 45  PLT 140* 146* 157   Lab Results  Component Value Date   TSH 1.89 06/17/2017   Lab Results  Component Value Date   HGBA1C 5.4 02/20/2017   No results found for: CHOL, HDL, LDLCALC, LDLDIRECT, TRIG, CHOLHDL  Significant Diagnostic Results in last 30 days:  No results found.  Assessment/Plan Major depression, recurrent, chronic (HCC) Relapsed, will resume Wellbutrin XL 150mg  po qod x 10 days, then daily, continue Trazodone 25mg  po qhs. Will update CBC/diff, CMP.  Observe.   Alzheimer's dementia with behavioral disturbance Continue SNF [facility] for safety and care assistance, continue Galantamine 8mg  bid for memory, supportive measures.      Family/ staff Communication: plan  of care reviewed with the patient and charge nurse.   Labs/tests ordered:  CBC/diff, CMP  Time spend 25 minutes.

## 2017-11-11 ENCOUNTER — Encounter: Payer: Self-pay | Admitting: Nurse Practitioner

## 2017-11-11 ENCOUNTER — Encounter: Payer: Self-pay | Admitting: *Deleted

## 2017-11-11 LAB — HEPATIC FUNCTION PANEL
ALK PHOS: 67 (ref 25–125)
ALT: 15 (ref 10–40)
AST: 16 (ref 14–40)
BILIRUBIN, TOTAL: 1.2

## 2017-11-11 LAB — BASIC METABOLIC PANEL
BUN: 18 (ref 4–21)
CREATININE: 0.8 (ref <=1.3)
Glucose: 96
Potassium: 3.9 (ref 3.4–5.3)
Sodium: 139 (ref 137–147)

## 2017-11-11 LAB — CBC AND DIFFERENTIAL
HCT: 41 (ref 41–53)
Hemoglobin: 14 (ref 13.5–17.5)
Platelets: 135 — AB (ref 150–399)
WBC: 7.4

## 2017-11-12 ENCOUNTER — Other Ambulatory Visit: Payer: Self-pay | Admitting: *Deleted

## 2017-11-12 LAB — COMPLETE METABOLIC PANEL WITH GFR
Albumin: 3.6
CHLORIDE: 106
Calcium: 8.8
Carbon Dioxide, Total: 25
GFR CALC NON AF AMER: 81
Globulin: 2.2
TOTAL PROTEIN: 5.8 g/dL

## 2017-11-26 ENCOUNTER — Encounter: Payer: Self-pay | Admitting: Nurse Practitioner

## 2017-11-26 ENCOUNTER — Non-Acute Institutional Stay (SKILLED_NURSING_FACILITY): Payer: Medicare Other | Admitting: Nurse Practitioner

## 2017-11-26 DIAGNOSIS — M549 Dorsalgia, unspecified: Secondary | ICD-10-CM

## 2017-11-26 DIAGNOSIS — G309 Alzheimer's disease, unspecified: Secondary | ICD-10-CM | POA: Diagnosis not present

## 2017-11-26 DIAGNOSIS — R635 Abnormal weight gain: Secondary | ICD-10-CM

## 2017-11-26 DIAGNOSIS — K219 Gastro-esophageal reflux disease without esophagitis: Secondary | ICD-10-CM

## 2017-11-26 DIAGNOSIS — F339 Major depressive disorder, recurrent, unspecified: Secondary | ICD-10-CM | POA: Diagnosis not present

## 2017-11-26 DIAGNOSIS — R634 Abnormal weight loss: Secondary | ICD-10-CM | POA: Insufficient documentation

## 2017-11-26 DIAGNOSIS — M79605 Pain in left leg: Secondary | ICD-10-CM

## 2017-11-26 DIAGNOSIS — F0281 Dementia in other diseases classified elsewhere with behavioral disturbance: Secondary | ICD-10-CM

## 2017-11-26 NOTE — Assessment & Plan Note (Signed)
Stable, continue Omeprazole 40mg qd.  

## 2017-11-26 NOTE — Assessment & Plan Note (Signed)
Stable, continue Tylenol 500mg  qd, 650mg  q8h prn.

## 2017-11-26 NOTE — Assessment & Plan Note (Signed)
Continue SNF [facility] for safety and care assistance, continue Galantamine 180mg  qd.

## 2017-11-26 NOTE — Assessment & Plan Note (Signed)
#  4Ibs in the past 6 months, no apparent swelling, SOB, cough, sputum production, or paroxysmal nocturnal orthopnea. Will weekly weight x4 to evaluate.

## 2017-11-26 NOTE — Progress Notes (Signed)
Location:  [facility] Room Number: 28 Place of Service:  SNF (31) Provider:  Chipper Oman  NP  Tylique Aull X, NP  Patient Care Team: Tierrah Anastos X, NP as PCP - General (Internal Medicine) Taysen Bushart X, NP as Nurse Practitioner (Internal Medicine)  Extended Emergency Contact Information Primary Emergency Contact: [facility] Address: [address]          [place],  [address] Home Phone: [phone] Relation: None  Code Status:  DNR Goals of care: Advanced Directive information Advanced Directives 11/26/2017  Does Patient Have a Medical Advance Directive? Yes  Type of Estate agent of [place];Out of facility DNR (pink MOST or yellow form)  Does patient want to make changes to medical advance directive? No - Patient declined  Copy of Healthcare Power of Attorney in Chart? -  Pre-existing out of facility DNR order (yellow form or pink MOST form) Yellow form placed in chart (order not valid for inpatient use)     Chief Complaint  Patient presents with  . Medical Management of Chronic Issues    F/u-Depression, Alzheimers, dementia, HTN, GERD, Insomnia, gait disorder    HPI:  Pt is a 84 y.o. male seen today for medical management of chronic diseases.    The patient has history of dementia, resides in SNF [facility] for safety and care assistance, on Galantamine 180mg  qd for memory. His mood is stabilizing on Wellbutrin 150mg  qd, Trazodone 25mg  qd. GERD stable on Omeprazole 40mg  qd. OA pain in knees is managed with Tylenol 500mg  qd and 650mg  q8h prn.   Past Medical History:  Diagnosis Date  . Anxiety   . Arthritis   . BPH (benign prostatic hyperplasia)   . Dementia (HCC)   . GERD (gastroesophageal reflux disease)   . Hypertension   . IBS (irritable bowel syndrome)    Past Surgical History:  Procedure Laterality Date  . CHOLECYSTECTOMY    . EYE SURGERY      No Known Allergies  Outpatient Encounter Medications as of 11/26/2017    Medication Sig  . acetaminophen (TYLENOL) 325 MG tablet Take 650 mg by mouth every 8 (eight) hours as needed.  Marland Kitchen acetaminophen (TYLENOL) 500 MG tablet Take 500 mg by mouth daily.  Marland Kitchen buPROPion (WELLBUTRIN XL) 150 MG 24 hr tablet Take 150 mg by mouth daily.  . fexofenadine (ALLEGRA) 180 MG tablet Take 180 mg by mouth daily.  Marland Kitchen galantamine (RAZADYNE) 8 MG tablet Take 8 mg by mouth 2 (two) times daily with a meal.   . omeprazole (PRILOSEC) 40 MG capsule Take 40 mg by mouth daily.   . traZODone (DESYREL) 50 MG tablet Take 25 mg by mouth at bedtime.   . [DISCONTINUED] loperamide (IMODIUM A-D) 2 MG tablet Take 2 mg by mouth as needed for diarrhea or loose stools. Check rectum for retained stool. If >3 loose stools in 24 hrs., hold all lax. and stool softeners. Give Imodium AD (loperamide) 4 mg po 1st dose, then 2 mg po after each loose stool X 48 hrs. Not to exceed 16mg  in 24 hrs. If not effective, notify MD   No facility-administered encounter medications on file as of 11/26/2017.    ROS was provided with assistance of staff Review of Systems  Constitutional: Positive for unexpected weight change. Negative for activity change, appetite change, chills, diaphoresis, fatigue and fever.       #4Iba/6 months.   HENT: Positive for hearing loss. Negative for congestion and voice change.  Respiratory: Negative for cough, shortness of breath and wheezing.   Cardiovascular: Positive for leg swelling. Negative for chest pain and palpitations.  Gastrointestinal: Negative for abdominal distention, abdominal pain, constipation, diarrhea, nausea and vomiting.  Genitourinary: Negative for difficulty urinating, dysuria and urgency.       Incontinent of urine.   Musculoskeletal: Positive for arthralgias and gait problem.  Skin: Negative for color change and pallor.  Neurological: Positive for speech difficulty. Negative for dizziness, weakness and headaches.       Expressive aphasia.   Psychiatric/Behavioral:  Positive for confusion. Negative for agitation, behavioral problems, hallucinations and sleep disturbance. The patient is not nervous/anxious.     Immunization History  Administered Date(s) Administered  . Influenza Whole 10/22/2017  . Influenza, High Dose Seasonal PF 11/08/2015, 11/07/2016  . Influenza-Unspecified 01/21/2014, 10/29/2015  . Pneumococcal Conjugate-13 04/16/2017  . Pneumococcal Polysaccharide-23 01/21/2014   Pertinent  Health Maintenance Due  Topic Date Due  . INFLUENZA VACCINE  Completed  . PNA vac Low Risk Adult  Completed   Fall Risk  04/18/2017  Falls in the past year? No   Functional Status Survey:    Vitals:   11/26/17 1234  BP: 130/80  Pulse: 74  Resp: 20  Temp: 98 F (36.7 C)  SpO2: 95%  Weight: 197 lb 8 oz (89.6 kg)  Height: 5\' 8"  (1.727 m)   Body mass index is 30.03 kg/m. Physical Exam  Constitutional: He appears well-developed and well-nourished.  HENT:  Head: Normocephalic and atraumatic.  Eyes: Pupils are equal, round, and reactive to light. EOM are normal.  Neck: Normal range of motion. Neck supple. No JVD present. No thyromegaly present.  Cardiovascular: Normal rate and regular rhythm.  No murmur heard. Pulmonary/Chest: Effort normal. He has no wheezes. He has no rales.  Abdominal: Soft. He exhibits no distension. There is no tenderness. There is no rebound and no guarding.  Musculoskeletal: He exhibits edema.  Trace edema BLE. Ambulates with walker.   Neurological: He is alert. No cranial nerve deficit. He exhibits normal muscle tone. Coordination normal.  Oriented to person and place.   Skin: Skin is warm and dry.  Psychiatric: He has a normal mood and affect. His behavior is normal.    Labs reviewed: Recent Labs    02/20/17 06/17/17 11/11/17  NA 138 141 139  K 4.1 4.2 3.9  CL  --   --  106  CO2  --   --  25  BUN 15 18 18   CREATININE 1.0 1.2 0.8  CALCIUM  --  9.4 8.8   Recent Labs    02/20/17 06/17/17 11/11/17  AST 14  12* 16  ALT 13 12 15   ALKPHOS 56 88 67  PROT  --   --  5.8  ALBUMIN  --  4.1 3.6   Recent Labs    04/16/17 06/17/17 11/11/17  WBC 7.4 8.5 7.4  HGB 13.7 15.5 14.0  HCT 40* 45 41  PLT 146* 157 135*   Lab Results  Component Value Date   TSH 1.89 06/17/2017   Lab Results  Component Value Date   HGBA1C 5.4 02/20/2017   No results found for: CHOL, HDL, LDLCALC, LDLDIRECT, TRIG, CHOLHDL  Significant Diagnostic Results in last 30 days:  No results found.  Assessment/Plan GERD (gastroesophageal reflux disease) Stable, continue Omeprazole 40mg  qd.   Alzheimer's dementia with behavioral disturbance (HCC) Continue SNF [facility] for safety and care assistance, continue Galantamine 180mg  qd.   Major depression, recurrent, chronic (HCC) Mood  is stabilizing, continue Trazodone 25mg  qd, Wellbutrin 150mg  qd. Failed discontinuation of Wellbutrin in the past.   Pain of back and left lower extremity Stable, continue Tylenol 500mg  qd, 650mg  q8h prn.   Weight gain #4Ibs in the past 6 months, no apparent swelling, SOB, cough, sputum production, or paroxysmal nocturnal orthopnea. Will weekly weight x4 to evaluate.      Family/ staff Communication: plan of care reviewed with the patient and charge nurse.   Labs/tests ordered: none  Time spend 25 minutes.

## 2017-11-26 NOTE — Assessment & Plan Note (Signed)
Mood is stabilizing, continue Trazodone 25mg  qd, Wellbutrin 150mg  qd. Failed discontinuation of Wellbutrin in the past.

## 2017-11-28 ENCOUNTER — Encounter: Payer: Self-pay | Admitting: Nurse Practitioner

## 2017-12-23 ENCOUNTER — Encounter: Payer: Self-pay | Admitting: Nurse Practitioner

## 2017-12-23 ENCOUNTER — Non-Acute Institutional Stay (SKILLED_NURSING_FACILITY): Payer: Medicare Other | Admitting: Nurse Practitioner

## 2017-12-23 DIAGNOSIS — M549 Dorsalgia, unspecified: Secondary | ICD-10-CM | POA: Diagnosis not present

## 2017-12-23 DIAGNOSIS — F339 Major depressive disorder, recurrent, unspecified: Secondary | ICD-10-CM | POA: Diagnosis not present

## 2017-12-23 DIAGNOSIS — M79605 Pain in left leg: Secondary | ICD-10-CM

## 2017-12-23 DIAGNOSIS — K219 Gastro-esophageal reflux disease without esophagitis: Secondary | ICD-10-CM

## 2017-12-23 DIAGNOSIS — G309 Alzheimer's disease, unspecified: Secondary | ICD-10-CM | POA: Diagnosis not present

## 2017-12-23 DIAGNOSIS — F0281 Dementia in other diseases classified elsewhere with behavioral disturbance: Secondary | ICD-10-CM

## 2017-12-23 NOTE — Progress Notes (Signed)
Location:  [facility] Room Number: 28 Place of Service:  SNF (31) Provider:  Chipper OmanMast, ManXie  NP  Alura Olveda X, NP  Patient Care Team: Aja Bolander X, NP as PCP - General (Internal Medicine) Anniebelle Devore X, NP as Nurse Practitioner (Internal Medicine)  Extended Emergency Contact Information Primary Emergency Contact: [facility] Address: [address]          [place],  [id number] Home Phone: [phone] Relation: None  Code Status:  DNR Goals of care: Advanced Directive information Advanced Directives 12/23/2017  Does Patient Have a Medical Advance Directive? Yes  Type of Estate agentAdvance Directive Healthcare Power of [place];Out of facility DNR (pink MOST or yellow form)  Does patient want to make changes to medical advance directive? No - Patient declined  Copy of Healthcare Power of Attorney in Chart? Yes - validated most recent copy scanned in chart (See row information)  Pre-existing out of facility DNR order (yellow form or pink MOST form) Yellow form placed in chart (order not valid for inpatient use)     Chief Complaint  Patient presents with  . Medical Management of Chronic Issues    F?U- GERD, dementia, depression, HTN, insomnia, Alzheimers,    HPI:  Pt is a 84 y.o. male seen today for medical management of chronic diseases.     The patient resides in SNF [facility], ambulates with furniture or walker, on Galantamine 8mg  bid for memory. His mood is stable on Bupropion 150mg  qd, Trazodone 25mg  qhs. GERD stable on Omeprazole 40mg  qd. Arthritic pain, mostly in lower back and knees are managed with Tylenol 500mg  qd and 650mg  q8h prn.  Past Medical History:  Diagnosis Date  . Anxiety   . Arthritis   . BPH (benign prostatic hyperplasia)   . Dementia (HCC)   . GERD (gastroesophageal reflux disease)   . Hypertension   . IBS (irritable bowel syndrome)    Past Surgical History:  Procedure Laterality Date  . CHOLECYSTECTOMY    . EYE SURGERY      No Known  Allergies  Outpatient Encounter Medications as of 12/23/2017  Medication Sig  . acetaminophen (TYLENOL) 325 MG tablet Take 650 mg by mouth every 8 (eight) hours as needed.  Marland Kitchen. acetaminophen (TYLENOL) 500 MG tablet Take 500 mg by mouth daily.  Marland Kitchen. buPROPion (WELLBUTRIN XL) 150 MG 24 hr tablet Take 150 mg by mouth daily.  . fexofenadine (ALLEGRA) 180 MG tablet Take 180 mg by mouth daily.  Marland Kitchen. galantamine (RAZADYNE) 8 MG tablet Take 8 mg by mouth 2 (two) times daily with a meal.   . omeprazole (PRILOSEC) 40 MG capsule Take 40 mg by mouth daily.   . traZODone (DESYREL) 50 MG tablet Take 25 mg by mouth at bedtime.    No facility-administered encounter medications on file as of 12/23/2017.    ROS was provided with assistance of staff Review of Systems  Constitutional: Negative for activity change, appetite change, chills, diaphoresis, fatigue, fever and unexpected weight change.  HENT: Positive for hearing loss. Negative for congestion and voice change.   Respiratory: Negative for cough, shortness of breath and wheezing.   Cardiovascular: Positive for leg swelling. Negative for chest pain and palpitations.  Gastrointestinal: Negative for abdominal distention, abdominal pain, constipation, diarrhea, nausea and vomiting.  Genitourinary: Negative for difficulty urinating, dysuria and urgency.  Musculoskeletal: Positive for gait problem.  Skin: Negative for color change and pallor.  Neurological: Negative for dizziness, speech difficulty, weakness and headaches.  Dementia  Psychiatric/Behavioral: Positive for confusion. Negative for agitation, behavioral problems, hallucinations and sleep disturbance. The patient is not nervous/anxious.     Immunization History  Administered Date(s) Administered  . Influenza Whole 10/22/2017  . Influenza, High Dose Seasonal PF 11/08/2015, 11/07/2016  . Influenza-Unspecified 01/21/2014, 10/29/2015  . Pneumococcal Conjugate-13 04/16/2017  . Pneumococcal  Polysaccharide-23 01/21/2014   Pertinent  Health Maintenance Due  Topic Date Due  . INFLUENZA VACCINE  Completed  . PNA vac Low Risk Adult  Completed   Fall Risk  04/18/2017  Falls in the past year? No   Functional Status Survey:    Vitals:   12/23/17 1111  BP: 130/80  Pulse: 80  Resp: 20  Temp: 98 F (36.7 C)  SpO2: 95%  Weight: 193 lb 12.8 oz (87.9 kg)  Height: 5\' 8"  (1.727 m)   Body mass index is 29.47 kg/m. Physical Exam  Constitutional: He appears well-developed and well-nourished.  HENT:  Head: Normocephalic and atraumatic.  Eyes: Pupils are equal, round, and reactive to light. EOM are normal.  Neck: Normal range of motion. Neck supple. No JVD present. No thyromegaly present.  Cardiovascular: Normal rate and regular rhythm.  No murmur heard. Pulmonary/Chest: Effort normal. He has no wheezes. He has no rales.  Abdominal: Soft. He exhibits no distension. There is no tenderness. There is no rebound and no guarding.  Musculoskeletal: He exhibits edema.  Trace edema BLE. Furniture or walker to ambulate  Neurological: He is alert. No cranial nerve deficit. He exhibits normal muscle tone. Coordination normal.  Oriented to person and his room on unit.   Skin: Skin is warm and dry.  Psychiatric: He has a normal mood and affect.    Labs reviewed: Recent Labs    02/20/17 06/17/17 11/11/17  NA 138 141 139  K 4.1 4.2 3.9  CL  --   --  106  CO2  --   --  25  BUN 15 18 18   CREATININE 1.0 1.2 0.8  CALCIUM  --  9.4 8.8   Recent Labs    02/20/17 06/17/17 11/11/17  AST 14 12* 16  ALT 13 12 15   ALKPHOS 56 88 67  PROT  --   --  5.8  ALBUMIN  --  4.1 3.6   Recent Labs    04/16/17 06/17/17 11/11/17  WBC 7.4 8.5 7.4  HGB 13.7 15.5 14.0  HCT 40* 45 41  PLT 146* 157 135*   Lab Results  Component Value Date   TSH 1.89 06/17/2017   Lab Results  Component Value Date   HGBA1C 5.4 02/20/2017   No results found for: CHOL, HDL, LDLCALC, LDLDIRECT, TRIG,  CHOLHDL  Significant Diagnostic Results in last 30 days:  No results found.  Assessment/Plan GERD (gastroesophageal reflux disease) Stable, continue Omeprazole 40mg  qd.   Alzheimer's dementia with behavioral disturbance (HCC) Continue SNF [facility] for safety and care assistance, continue Galantamine 8mg  bid.   Major depression, recurrent, chronic (HCC) His mood is stabilized, continue Bupropion 150mg  qd, Trazodone 25mg  qhs.   Pain of back and left lower extremity Continue Tylenol 500mg  qd, 650mg  q8hr prn     Family/ staff Communication:plan of care reviewed with the patient and charge nurse.   Labs/tests ordered:  none  Time spend 25 minutes.

## 2017-12-23 NOTE — Assessment & Plan Note (Signed)
Continue Tylenol 500mg  qd, 650mg  q8hr prn

## 2017-12-23 NOTE — Assessment & Plan Note (Signed)
His mood is stabilized, continue Bupropion 150mg  qd, Trazodone 25mg  qhs.

## 2017-12-23 NOTE — Assessment & Plan Note (Signed)
Stable, continue Omeprazole 40mg qd.  

## 2017-12-23 NOTE — Assessment & Plan Note (Signed)
Continue SNF [facility] for safety and care assistance, continue Galantamine 8mg bid.  

## 2017-12-24 ENCOUNTER — Encounter: Payer: Self-pay | Admitting: Nurse Practitioner

## 2018-01-22 ENCOUNTER — Encounter: Payer: Self-pay | Admitting: Family Medicine

## 2018-01-22 ENCOUNTER — Non-Acute Institutional Stay (SKILLED_NURSING_FACILITY): Payer: Medicare Other | Admitting: Family Medicine

## 2018-01-22 DIAGNOSIS — G309 Alzheimer's disease, unspecified: Secondary | ICD-10-CM

## 2018-01-22 DIAGNOSIS — F339 Major depressive disorder, recurrent, unspecified: Secondary | ICD-10-CM

## 2018-01-22 DIAGNOSIS — I10 Essential (primary) hypertension: Secondary | ICD-10-CM | POA: Diagnosis not present

## 2018-01-22 DIAGNOSIS — K219 Gastro-esophageal reflux disease without esophagitis: Secondary | ICD-10-CM

## 2018-01-22 DIAGNOSIS — F0281 Dementia in other diseases classified elsewhere with behavioral disturbance: Secondary | ICD-10-CM

## 2018-01-22 NOTE — Progress Notes (Signed)
Provider:  Jacalyn Lefevre, MD Location:  [facility] Room Number: 28 Place of Service:  SNF (31)  PCP: Mast, Man X, NP Patient Care Team: Mast, Man X, NP as PCP - General (Internal Medicine) Mast, Man X, NP as Nurse Practitioner (Internal Medicine)  Extended Emergency Contact Information Primary Emergency Contact: [facility] Address: [address]          [place],  [address] Home Phone: [phone] Relation: None  Code Status: DNR Goals of Care: Advanced Directive information Advanced Directives 01/22/2018  Does Patient Have a Medical Advance Directive? Yes  Type of Estate agent of [place];Out of facility DNR (pink MOST or yellow form)  Does patient want to make changes to medical advance directive? No - Patient declined  Copy of Healthcare Power of Attorney in Chart? Yes - validated most recent copy scanned in chart (See row information)  Pre-existing out of facility DNR order (yellow form or pink MOST form) Yellow form placed in chart (order not valid for inpatient use)      Chief Complaint  Patient presents with  . Medical Management of Chronic Issues    routine visit     HPI: Patient is a 85 y.o. male seen today for medical management of chronic problems including: Alzheimer's disease, aphasia, hypertension.  Nursing reports no issues or problems with this gentleman.  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  He takes both trazodone and Wellbutrin for depression and Razadyne for memory.  He describes some frequent constipation and uses that as a reason to not venture far from his room.  I note that he is on cholestyramine but unsure if this is for lipids or loose stools; I suspect the latter.  He seems a little preoccupied with money and who is controlling that.  Past Medical History:  Diagnosis Date  . Anxiety   . Arthritis   . BPH (benign prostatic hyperplasia)    . Dementia (HCC)   . GERD (gastroesophageal reflux disease)   . Hypertension   . IBS (irritable bowel syndrome)    Past Surgical History:  Procedure Laterality Date  . CHOLECYSTECTOMY    . EYE SURGERY      reports that he has never smoked. He has never used smokeless tobacco. He reports that he does not drink alcohol or use drugs. Social History   Socioeconomic History  . Marital status: Married    Spouse name: Not on file  . Number of children: Not on file  . Years of education: Not on file  . Highest education level: Not on file  Occupational History  . Not on file  Social Needs  . Financial resource strain: Not hard at all  . Food insecurity:    Worry: Never true    Inability: Never true  . Transportation needs:    Medical: No    Non-medical: No  Tobacco Use  . Smoking status: Never Smoker  . Smokeless tobacco: Never Used  Substance and Sexual Activity  . Alcohol use: No  . Drug use: No  . Sexual activity: Not on file  Lifestyle  . Physical activity:    Days per week: 2 days    Minutes per session: 30 min  . Stress: Only a little  Relationships  . Social connections:    Talks on phone: More than three times a week    Gets together: More than three times a week  Attends religious service: Never    Active member of club or organization: No    Attends meetings of clubs or organizations: Never    Relationship status: Married  . Intimate partner violence:    Fear of current or ex partner: No    Emotionally abused: No    Physically abused: No    Forced sexual activity: No  Other Topics Concern  . Not on file  Social History Narrative   Do you drink/eat things with caffeine?   Marital status: Widowed, What year were you married? 1954   Do you live in a apartment? Yes    How many people live in the home? 1   Do you have pets? No   Current or past profession: Professor Psychology  - [employer]   Do you exercise? Yes, walk   Do you have a living will?  Yes     Functional Status Survey:    Family History  Problem Relation Age of Onset  . Heart disease Father     Health Maintenance  Topic Date Due  . TETANUS/TDAP  04/25/2027  . INFLUENZA VACCINE  Completed  . PNA vac Low Risk Adult  Completed    No Known Allergies  Outpatient Encounter Medications as of 01/22/2018  Medication Sig  . acetaminophen (TYLENOL) 325 MG tablet Take 650 mg by mouth every 8 (eight) hours as needed.  Marland Kitchen. acetaminophen (TYLENOL) 500 MG tablet Take 500 mg by mouth daily.  Marland Kitchen. buPROPion (WELLBUTRIN XL) 150 MG 24 hr tablet Take 150 mg by mouth daily.  . fexofenadine (ALLEGRA) 180 MG tablet Take 180 mg by mouth daily.  Marland Kitchen. galantamine (RAZADYNE) 8 MG tablet Take 8 mg by mouth 2 (two) times daily with a meal.   . traZODone (DESYREL) 50 MG tablet Take 25 mg by mouth at bedtime.   . [DISCONTINUED] omeprazole (PRILOSEC) 40 MG capsule Take 40 mg by mouth daily.    No facility-administered encounter medications on file as of 01/22/2018.     Review of Systems  Constitutional: Negative.   HENT: Negative.   Respiratory: Negative.   Cardiovascular: Negative.   Gastrointestinal: Positive for diarrhea.  Musculoskeletal: Positive for gait problem.  Psychiatric/Behavioral: Positive for confusion.    Vitals:   01/22/18 1251  BP: 140/70  Pulse: 82  Resp: 20  Temp: 98.4 F (36.9 C)  TempSrc: Oral  SpO2: 95%  Weight: 189 lb 3.2 oz (85.8 kg)  Height: 5\' 8"  (1.727 m)   Body mass index is 28.77 kg/m. Physical Exam Vitals signs and nursing note reviewed.  Constitutional:      Appearance: Normal appearance. He is normal weight.  HENT:     Head: Normocephalic.     Nose: Nose normal.  Cardiovascular:     Rate and Rhythm: Normal rate. Rhythm irregular.     Heart sounds: Normal heart sounds.  Pulmonary:     Effort: Pulmonary effort is normal.     Breath sounds: Normal breath sounds.  Abdominal:     General: Bowel sounds are normal.  Neurological:     General: No  focal deficit present.     Mental Status: He is alert and oriented to person, place, and time.  Psychiatric:        Mood and Affect: Mood normal.        Behavior: Behavior normal.     Labs reviewed: Basic Metabolic Panel: Recent Labs    02/20/17 06/17/17 11/11/17  NA 138 141 139  K 4.1 4.2  3.9  CL  --   --  106  CO2  --   --  25  BUN 15 18 18   CREATININE 1.0 1.2 0.8  CALCIUM  --  9.4 8.8   Liver Function Tests: Recent Labs    02/20/17 06/17/17 11/11/17  AST 14 12* 16  ALT 13 12 15   ALKPHOS 56 88 67  PROT  --   --  5.8  ALBUMIN  --  4.1 3.6   No results for input(s): LIPASE, AMYLASE in the last 8760 hours. No results for input(s): AMMONIA in the last 8760 hours. CBC: Recent Labs    04/16/17 06/17/17 11/11/17  WBC 7.4 8.5 7.4  HGB 13.7 15.5 14.0  HCT 40* 45 41  PLT 146* 157 135*   Cardiac Enzymes: No results for input(s): CKTOTAL, CKMB, CKMBINDEX, TROPONINI in the last 8760 hours. BNP: Invalid input(s): POCBNP Lab Results  Component Value Date   HGBA1C 5.4 02/20/2017   Lab Results  Component Value Date   TSH 1.89 06/17/2017   No results found for: VITAMINB12 No results found for: FOLATE No results found for: IRON, TIBC, FERRITIN  Imaging and Procedures obtained prior to SNF admission: Dg Foot Complete Right  Result Date: 07/02/2016 CLINICAL DATA:  [facility] swelling and erythema x 3 days. Patient has dementia and unsure if fell or injured foot to cause symptoms. patient c/o pain with movement and bearing weight EXAM: RIGHT FOOT COMPLETE - 3+ VIEW COMPARISON:  CT, 01/07/2003 FINDINGS: No fracture or dislocation. No bone lesion. No bone resorption is seen to suggest osteomyelitis. There is asymmetric joint space narrowing of the first metatarsophalangeal joint with small marginal osteophytes consistent with osteoarthritis. Remaining joints are normally spaced and aligned. Mild dorsal subcutaneous soft  tissue edema. Soft tissues otherwise unremarkable. IMPRESSION: 1. No fracture, dislocation or acute finding. No evidence of osteomyelitis. 2. Moderate first metatarsophalangeal joint osteoarthritis. Electronically Signed   By: Amie Portlandavid  Ormond M.D.   On: 07/02/2016 09:51    Assessment/Plan 1. Essential hypertension Blood pressure today is 140/70.  1 month ago 130/80.  He takes losartan daily  2. Gastroesophageal reflux disease without esophagitis Voiced no symptoms or complaints today.  We will continue with omeprazole 40 mg daily  3. Alzheimer's dementia with behavioral disturbance, unspecified timing of dementia onset (HCC) He is able to carry on conversation but frequent references to his money and who is managing it and what happened to it and talk of leaving this place and ability to care for himself are not totally in touch with reality  4. Major depression, recurrent, chronic (HCC) Seems to not be depressed today.  As noted he is on Wellbutrin for depression.  Also takes trazodone but that appears to be more for sleep and present dosage   Family/ staff Communication: Findings communicated to nursing staff  Labs/tests ordered:  Bertram MillardStephen M. Hyacinth MeekerMiller, MD [facility] [address] [place], [place] [address] Office [phone]

## 2018-01-27 ENCOUNTER — Encounter: Payer: Self-pay | Admitting: Nurse Practitioner

## 2018-01-27 NOTE — Progress Notes (Signed)
Opened in eror

## 2018-02-12 ENCOUNTER — Encounter: Payer: Self-pay | Admitting: Nurse Practitioner

## 2018-02-12 ENCOUNTER — Non-Acute Institutional Stay (SKILLED_NURSING_FACILITY): Payer: Medicare Other | Admitting: Nurse Practitioner

## 2018-02-12 DIAGNOSIS — F0281 Dementia in other diseases classified elsewhere with behavioral disturbance: Secondary | ICD-10-CM

## 2018-02-12 DIAGNOSIS — G309 Alzheimer's disease, unspecified: Secondary | ICD-10-CM

## 2018-02-12 DIAGNOSIS — F339 Major depressive disorder, recurrent, unspecified: Secondary | ICD-10-CM | POA: Diagnosis not present

## 2018-02-12 NOTE — Assessment & Plan Note (Signed)
Mood is not well controlled, will increase Wellbutrin 300mg  qd, continue Trazodone 25mg  qd, observe.

## 2018-02-12 NOTE — Progress Notes (Signed)
Location:  [facility] Room Number: 28 Place of Service:  SNF (31) Provider:  Chipper Oman  NP  Jazzman Loughmiller X, NP  Patient Care Team: Nevaeha Finerty X, NP as PCP - General (Internal Medicine) Daysean Tinkham X, NP as Nurse Practitioner (Internal Medicine)  Extended Emergency Contact Information Primary Emergency Contact: [facility] Address: [address]          [place],  [address] Home Phone: [phone] Relation: None  Code Status:  DNR Goals of care: Advanced Directive information Advanced Directives 01/22/2018  Does Patient Have a Medical Advance Directive? Yes  Type of Estate agent of [place];Out of facility DNR (pink MOST or yellow form)  Does patient want to make changes to medical advance directive? No - Patient declined  Copy of Healthcare Power of Attorney in Chart? Yes - validated most recent copy scanned in chart (See row information)  Pre-existing out of facility DNR order (yellow form or pink MOST form) Yellow form placed in chart (order not valid for inpatient use)     Chief Complaint  Patient presents with  . Acute Visit    Family concerns     HPI:  Pt is a 85 y.o. male seen today for an acute visit for the resident's escalated worry regarding money and wanting to leave [facility]. He denied pain or physical discomfort/concerns. HPI was provided with assistance of staff and family. He takes Trazodone 25mg  qhs, Wellbutrin 150mg  qd for mood. Galantamine 8mg  bid for memory.    Past Medical History:  Diagnosis Date  . Anxiety   . Arthritis   . BPH (benign prostatic hyperplasia)   . Dementia (HCC)   . GERD (gastroesophageal reflux disease)   . Hypertension   . IBS (irritable bowel syndrome)    Past Surgical History:  Procedure Laterality Date  . CHOLECYSTECTOMY    . EYE SURGERY      No Known Allergies  Outpatient Encounter Medications as of 02/12/2018  Medication Sig  . acetaminophen (TYLENOL) 325 MG tablet Take 650  mg by mouth every 8 (eight) hours as needed.  Marland Kitchen acetaminophen (TYLENOL) 500 MG tablet Take 500 mg by mouth daily.  Marland Kitchen buPROPion (WELLBUTRIN XL) 150 MG 24 hr tablet Take 150 mg by mouth daily.  . fexofenadine (ALLEGRA) 180 MG tablet Take 180 mg by mouth daily.  Marland Kitchen galantamine (RAZADYNE) 8 MG tablet Take 8 mg by mouth 2 (two) times daily with a meal.   . omeprazole (PRILOSEC) 20 MG capsule Take 20 mg by mouth daily.  . traZODone (DESYREL) 50 MG tablet Take 25 mg by mouth at bedtime.    No facility-administered encounter medications on file as of 02/12/2018.    ROS was provided with assistance of staff and family.  Review of Systems  Constitutional: Negative for activity change, appetite change, chills, diaphoresis, fatigue and fever.  HENT: Positive for hearing loss. Negative for congestion and voice change.   Eyes: Negative for visual disturbance.  Respiratory: Negative for cough, shortness of breath and wheezing.   Cardiovascular: Positive for leg swelling. Negative for chest pain and palpitations.  Gastrointestinal: Negative for abdominal distention, abdominal pain, constipation, diarrhea, nausea and vomiting.  Genitourinary: Negative for difficulty urinating, dysuria and urgency.  Musculoskeletal: Positive for arthralgias, back pain and gait problem.  Skin: Negative for color change and pallor.  Neurological: Positive for speech difficulty. Negative for dizziness, weakness and headaches.       Dementia, expressive aphasia.   Psychiatric/Behavioral: Positive for behavioral  problems and confusion. Negative for agitation and hallucinations. The patient is nervous/anxious.     Immunization History  Administered Date(s) Administered  . Influenza Whole 10/22/2017  . Influenza, High Dose Seasonal PF 11/08/2015, 11/07/2016  . Influenza-Unspecified 01/21/2014, 10/29/2015  . Pneumococcal Conjugate-13 04/16/2017  . Pneumococcal Polysaccharide-23 01/21/2014   Pertinent  Health Maintenance Due    Topic Date Due  . INFLUENZA VACCINE  Completed  . PNA vac Low Risk Adult  Completed   Fall Risk  04/18/2017  Falls in the past year? No   Functional Status Survey:    Vitals:   02/12/18 1056  BP: (!) 142/90  Pulse: 77  Resp: 18  Temp: 98.1 F (36.7 C)  SpO2: 97%  Weight: 189 lb 3.2 oz (85.8 kg)  Height: 5\' 8"  (1.727 m)   Body mass index is 28.77 kg/m. Physical Exam Constitutional:      General: He is not in acute distress.    Appearance: Normal appearance. He is not ill-appearing, toxic-appearing or diaphoretic.  HENT:     Head: Normocephalic and atraumatic.     Nose: Nose normal.     Mouth/Throat:     Mouth: Mucous membranes are moist.  Eyes:     Extraocular Movements: Extraocular movements intact.     Pupils: Pupils are equal, round, and reactive to light.  Neck:     Musculoskeletal: Normal range of motion and neck supple.  Cardiovascular:     Rate and Rhythm: Normal rate and regular rhythm.     Heart sounds: No murmur.  Pulmonary:     Effort: Pulmonary effort is normal.     Breath sounds: No wheezing, rhonchi or rales.  Abdominal:     General: There is no distension.     Palpations: Abdomen is soft.     Tenderness: There is no abdominal tenderness. There is no guarding.  Musculoskeletal:     Right lower leg: Edema present.     Left lower leg: Edema present.     Comments: Trace edema. Ambulates with walker.   Skin:    General: Skin is warm and dry.  Neurological:     General: No focal deficit present.     Mental Status: He is alert.     Cranial Nerves: No cranial nerve deficit.     Motor: No weakness.     Coordination: Coordination normal.     Gait: Gait abnormal.     Comments: Appears anxious, but smiled upon my visit, oriented to self.      Labs reviewed: Recent Labs    02/20/17 06/17/17 11/11/17  NA 138 141 139  K 4.1 4.2 3.9  CL  --   --  106  CO2  --   --  25  BUN 15 18 18   CREATININE 1.0 1.2 0.8  CALCIUM  --  9.4 8.8   Recent Labs     02/20/17 06/17/17 11/11/17  AST 14 12* 16  ALT 13 12 15   ALKPHOS 56 88 67  PROT  --   --  5.8  ALBUMIN  --  4.1 3.6   Recent Labs    04/16/17 06/17/17 11/11/17  WBC 7.4 8.5 7.4  HGB 13.7 15.5 14.0  HCT 40* 45 41  PLT 146* 157 135*   Lab Results  Component Value Date   TSH 1.89 06/17/2017   Lab Results  Component Value Date   HGBA1C 5.4 02/20/2017   No results found for: CHOL, HDL, LDLCALC, LDLDIRECT, TRIG, CHOLHDL  Significant  Diagnostic Results in last 30 days:  No results found.  Assessment/Plan Major depression, recurrent, chronic (HCC) Mood is not well controlled, will increase Wellbutrin 300mg  qd, continue Trazodone 25mg  qd, observe.   Alzheimer's dementia with behavioral disturbance (HCC) Continue SNF [facility] for safety and care assistance, continue Galantamine 8mg  bid for memory.      Family/ staff Communication: plan of care reviewed with the patient and charge nurse.   Labs/tests ordered:  none  Time spend 25 minutes.

## 2018-02-12 NOTE — Assessment & Plan Note (Signed)
Continue SNF [facility] for safety and care assistance, continue Galantamine 8mg  bid for memory.

## 2018-02-23 ENCOUNTER — Non-Acute Institutional Stay (SKILLED_NURSING_FACILITY): Payer: Medicare Other | Admitting: Nurse Practitioner

## 2018-02-23 ENCOUNTER — Encounter: Payer: Self-pay | Admitting: Nurse Practitioner

## 2018-02-23 DIAGNOSIS — K219 Gastro-esophageal reflux disease without esophagitis: Secondary | ICD-10-CM

## 2018-02-23 DIAGNOSIS — G309 Alzheimer's disease, unspecified: Secondary | ICD-10-CM

## 2018-02-23 DIAGNOSIS — F0281 Dementia in other diseases classified elsewhere with behavioral disturbance: Secondary | ICD-10-CM

## 2018-02-23 DIAGNOSIS — M79605 Pain in left leg: Secondary | ICD-10-CM

## 2018-02-23 DIAGNOSIS — R269 Unspecified abnormalities of gait and mobility: Secondary | ICD-10-CM | POA: Diagnosis not present

## 2018-02-23 DIAGNOSIS — M549 Dorsalgia, unspecified: Secondary | ICD-10-CM

## 2018-02-23 DIAGNOSIS — F339 Major depressive disorder, recurrent, unspecified: Secondary | ICD-10-CM | POA: Diagnosis not present

## 2018-02-23 NOTE — Assessment & Plan Note (Signed)
His mood is stabilizing, continue Wellbutrin 300mg  qd, Trazodone 25mg  qd.

## 2018-02-23 NOTE — Assessment & Plan Note (Signed)
Continue SNF [facility] for safety and care assistance, continue Galantamine 8mg bid for memory.  

## 2018-02-23 NOTE — Progress Notes (Signed)
Location:  [facility] Room Number: 28 Place of Service:  SNF (31) Provider:  Chipper OmanMast, ManXie  NP  Lynnsie Linders X, NP  Patient Care Team: Nikesha Kwasny X, NP as PCP - General (Internal Medicine) Minami Arriaga X, NP as Nurse Practitioner (Internal Medicine)  Extended Emergency Contact Information [facility] Address: [address]          [place],  [id number] Home Phone: [phone] Relation: None  Code Status:  DNR Goals of care: Advanced Directive information Advanced Directives 02/23/2018  Does Patient Have a Medical Advance Directive? Yes  Type of Estate agentAdvance Directive Healthcare Power of [place];Out of facility DNR (pink MOST or yellow form)  Does patient want to make changes to medical advance directive? No - Patient declined  Copy of Healthcare Power of Attorney in Chart? Yes - validated most recent copy scanned in chart (See row information)  Pre-existing out of facility DNR order (yellow form or pink MOST form) Yellow form placed in chart (order not valid for inpatient use)     Chief Complaint  Patient presents with  . Medical Management of Chronic Issues    HPI:  Pt is a 85 y.o. male seen today for medical management of chronic diseases.     The patient has hx of dementia, [facility], on Galantamine 8mg  bid for memory. His mood is improving on Wellbutrin 300mg  qd, Trazodone 25mg  qd. GERD, stable on Omeprazole 20mg  qd. Lower back pain is managed with Tylenol 500mg  qd.    Past Medical History:  Diagnosis Date  . Anxiety   . Arthritis   . BPH (benign prostatic hyperplasia)   . Dementia (HCC)   . GERD (gastroesophageal reflux disease)   . Hypertension   . IBS (irritable bowel syndrome)    Past Surgical History:  Procedure Laterality Date  . CHOLECYSTECTOMY    . EYE SURGERY      No Known Allergies  Outpatient Encounter Medications as of 02/23/2018  Medication Sig  . acetaminophen  (TYLENOL) 325 MG tablet Take 650 mg by mouth every 8 (eight) hours as needed.  Marland Kitchen. acetaminophen (TYLENOL) 500 MG tablet Take 500 mg by mouth daily.  Marland Kitchen. buPROPion (WELLBUTRIN XL) 300 MG 24 hr tablet Take 300 mg by mouth daily.  . fexofenadine (ALLEGRA) 180 MG tablet Take 180 mg by mouth daily.  Marland Kitchen. galantamine (RAZADYNE) 8 MG tablet Take 8 mg by mouth 2 (two) times daily with a meal.   . omeprazole (PRILOSEC) 20 MG capsule Take 20 mg by mouth daily.  . traZODone (DESYREL) 50 MG tablet Take 25 mg by mouth at bedtime.   . [DISCONTINUED] buPROPion (WELLBUTRIN XL) 150 MG 24 hr tablet Take 150 mg by mouth daily.   No facility-administered encounter medications on file as of 02/23/2018.    ROS was provided with assistance of staff Review of Systems  Constitutional: Negative for activity change, appetite change, chills, diaphoresis, fatigue, fever and unexpected weight change.  HENT: Positive for hearing loss. Negative for congestion and voice change.   Respiratory: Negative for cough, shortness of breath and wheezing.   Cardiovascular: Positive for leg swelling. Negative for chest pain and palpitations.  Gastrointestinal: Negative for abdominal distention, abdominal pain, constipation, diarrhea, nausea and vomiting.  Genitourinary: Negative for difficulty urinating, dysuria and urgency.  Musculoskeletal: Positive for arthralgias, back pain and gait problem.  Skin: Negative for color change and pallor.  Neurological: Positive for speech difficulty. Negative for  dizziness, weakness and headaches.       Dementia, expressive aphasia.   Psychiatric/Behavioral: Positive for confusion. Negative for agitation, behavioral problems, hallucinations and sleep disturbance. The patient is nervous/anxious.     Immunization History  Administered Date(s) Administered  . Influenza Whole 10/22/2017  . Influenza, High Dose Seasonal PF 11/08/2015, 11/07/2016  . Influenza-Unspecified 01/21/2014, 10/29/2015  .  Pneumococcal Conjugate-13 04/16/2017  . Pneumococcal Polysaccharide-23 01/21/2014   Pertinent  Health Maintenance Due  Topic Date Due  . INFLUENZA VACCINE  Completed  . PNA vac Low Risk Adult  Completed   Fall Risk  04/18/2017  Falls in the past year? No   Functional Status Survey:    Vitals:   02/23/18 1257  BP: 138/68  Pulse: 90  Resp: 18  Temp: (!) 95.7 F (35.4 C)  SpO2: 97%  Weight: 189 lb 6.4 oz (85.9 kg)  Height: 5\' 8"  (1.727 m)   Body mass index is 28.8 kg/m. Physical Exam Constitutional:      General: He is not in acute distress.    Appearance: Normal appearance. He is not ill-appearing, toxic-appearing or diaphoretic.     Comments: Over weight  HENT:     Head: Normocephalic and atraumatic.     Nose: Nose normal. No congestion or rhinorrhea.     Mouth/Throat:     Mouth: Mucous membranes are moist.  Eyes:     Extraocular Movements: Extraocular movements intact.     Pupils: Pupils are equal, round, and reactive to light.  Cardiovascular:     Rate and Rhythm: Normal rate and regular rhythm.     Heart sounds: No murmur.  Pulmonary:     Effort: Pulmonary effort is normal.     Breath sounds: No wheezing, rhonchi or rales.  Abdominal:     Palpations: Abdomen is soft.     Tenderness: There is no abdominal tenderness. There is no guarding or rebound.  Musculoskeletal:     Right lower leg: Edema present.     Left lower leg: Edema present.     Comments: Trace edema BLE. Ambulates with walker.   Skin:    General: Skin is warm and dry.     Coloration: Skin is not jaundiced or pale.  Neurological:     General: No focal deficit present.     Mental Status: He is alert. Mental status is at baseline.     Cranial Nerves: No cranial nerve deficit.     Motor: No weakness.     Coordination: Coordination normal.     Gait: Gait abnormal.     Comments: Oriented to person and his room on unit.   Psychiatric:        Mood and Affect: Mood normal.        Behavior:  Behavior normal.     Labs reviewed: Recent Labs    06/17/17 11/11/17  NA 141 139  K 4.2 3.9  CL  --  106  CO2  --  25  BUN 18 18  CREATININE 1.2 0.8  CALCIUM 9.4 8.8   Recent Labs    06/17/17 11/11/17  AST 12* 16  ALT 12 15  ALKPHOS 88 67  PROT  --  5.8  ALBUMIN 4.1 3.6   Recent Labs    04/16/17 06/17/17 11/11/17  WBC 7.4 8.5 7.4  HGB 13.7 15.5 14.0  HCT 40* 45 41  PLT 146* 157 135*   Lab Results  Component Value Date   TSH 1.89 06/17/2017   Lab  Results  Component Value Date   HGBA1C 5.4 02/20/2017   No results found for: CHOL, HDL, LDLCALC, LDLDIRECT, TRIG, CHOLHDL  Significant Diagnostic Results in last 30 days:  No results found.  Assessment/Plan Alzheimer's dementia with behavioral disturbance (HCC) Continue SNF [facility] for safety and care assistance, continue Galantamine 8mg  bid for memory.   GERD (gastroesophageal reflux disease) Stable, continue Omeprazole 20mg  qd.   Gait disorder Continue to ambulate with walker, risk for falling, close supervision for safety.   Major depression, recurrent, chronic (HCC) His mood is stabilizing, continue Wellbutrin 300mg  qd, Trazodone 25mg  qd.   Pain of back and left lower extremity Stable, continue Tylenol 500mg  qd and prn.      Family/ staff Communication: plan of care reviewed with the patient and charge nurse.   Labs/tests ordered:  none  Time spend 25 minutes.

## 2018-02-23 NOTE — Assessment & Plan Note (Signed)
Stable, continue Tylenol 500mg  qd and prn.

## 2018-02-23 NOTE — Assessment & Plan Note (Signed)
Continue to ambulate with walker, risk for falling, close supervision for safety.

## 2018-02-23 NOTE — Assessment & Plan Note (Signed)
Stable, continue Omeprazole 20mg qd.  

## 2018-03-02 ENCOUNTER — Encounter: Payer: Self-pay | Admitting: Nurse Practitioner

## 2018-03-02 ENCOUNTER — Non-Acute Institutional Stay (SKILLED_NURSING_FACILITY): Payer: Medicare Other | Admitting: Nurse Practitioner

## 2018-03-02 DIAGNOSIS — I10 Essential (primary) hypertension: Secondary | ICD-10-CM

## 2018-03-02 DIAGNOSIS — F339 Major depressive disorder, recurrent, unspecified: Secondary | ICD-10-CM

## 2018-03-02 DIAGNOSIS — M549 Dorsalgia, unspecified: Secondary | ICD-10-CM

## 2018-03-02 DIAGNOSIS — F0281 Dementia in other diseases classified elsewhere with behavioral disturbance: Secondary | ICD-10-CM

## 2018-03-02 DIAGNOSIS — M79605 Pain in left leg: Secondary | ICD-10-CM

## 2018-03-02 DIAGNOSIS — G309 Alzheimer's disease, unspecified: Secondary | ICD-10-CM

## 2018-03-02 NOTE — Assessment & Plan Note (Signed)
Will increase Tylenol to 500mg  tid, adding Cymbalta 30mg  for mood and lower back pain. Observe.

## 2018-03-02 NOTE — Assessment & Plan Note (Signed)
Persists, continue SNF [facility] for safety and care assistance. Continue Galantamine 8mg  bid.

## 2018-03-02 NOTE — Progress Notes (Signed)
Location:  [facility] Room Number: 28 Place of Service:  SNF (31) Provider:  Chipper Oman  NP  Ahleah Simko X, NP  Patient Care Team: Sanay Belmar X, NP as PCP - General (Internal Medicine) Kloee Ballew X, NP as Nurse Practitioner (Internal Medicine)  Extended Emergency Contact Information Primary Emergency Contact: [facility] Address: [address]          [place],  [address] Home Phone: [phone] Relation: None  Code Status:  DNR Goals of care: Advanced Directive information Advanced Directives 02/23/2018  Does Patient Have a Medical Advance Directive? Yes  Type of Estate agent of [place];Out of facility DNR (pink MOST or yellow form)  Does patient want to make changes to medical advance directive? No - Patient declined  Copy of Healthcare Power of Attorney in Chart? Yes - validated most recent copy scanned in chart (See row information)  Pre-existing out of facility DNR order (yellow form or pink MOST form) Yellow form placed in chart (order not valid for inpatient use)     Chief Complaint  Patient presents with  . Acute Visit    C/o- low back pain and agitation    HPI:  Pt is a 85 y.o. male seen today for an acute visit for the patient was reportedly irritable for a week or two. On Wellbutrin 300mg  qd, Trazodone 25mg  qhs. Hx of dementia, on Galantamine 8mg  bid. Chronic lower back pain, on Tylenol 500mg  qd. He ambulates with walker. He denied dysuria, abd pain, nausea, vomiting. He is afebrile.    Past Medical History:  Diagnosis Date  . Anxiety   . Arthritis   . BPH (benign prostatic hyperplasia)   . Dementia (HCC)   . GERD (gastroesophageal reflux disease)   . Hypertension   . IBS (irritable bowel syndrome)    Past Surgical History:  Procedure Laterality Date  . CHOLECYSTECTOMY    . EYE SURGERY      No Known Allergies  Outpatient Encounter Medications as of 03/02/2018  Medication Sig  . acetaminophen  (TYLENOL) 325 MG tablet Take 650 mg by mouth every 8 (eight) hours as needed.  Marland Kitchen acetaminophen (TYLENOL) 500 MG tablet Take 500 mg by mouth daily.  Marland Kitchen buPROPion (WELLBUTRIN XL) 300 MG 24 hr tablet Take 300 mg by mouth daily.  . fexofenadine (ALLEGRA) 180 MG tablet Take 180 mg by mouth daily.  Marland Kitchen galantamine (RAZADYNE) 8 MG tablet Take 8 mg by mouth 2 (two) times daily with a meal.   . omeprazole (PRILOSEC) 20 MG capsule Take 20 mg by mouth daily.  . traZODone (DESYREL) 50 MG tablet Take 25 mg by mouth at bedtime.    No facility-administered encounter medications on file as of 03/02/2018.    ROS was provided with assistance of staff Review of Systems  Constitutional: Negative for activity change, appetite change, chills, diaphoresis, fatigue and fever.  HENT: Positive for hearing loss. Negative for congestion and voice change.   Eyes: Negative for visual disturbance.  Respiratory: Negative for cough, shortness of breath and wheezing.   Gastrointestinal: Negative for abdominal distention, abdominal pain, constipation, diarrhea, nausea and vomiting.  Genitourinary: Negative for difficulty urinating, dysuria and urgency.  Musculoskeletal: Positive for arthralgias, back pain and gait problem.  Skin: Negative for color change and pallor.  Neurological: Positive for speech difficulty. Negative for dizziness, weakness and headaches.       Dementia, expressive aphasia.   Psychiatric/Behavioral: Positive for agitation, behavioral problems, confusion and dysphoric mood. Negative  for hallucinations and sleep disturbance. The patient is nervous/anxious.     Immunization History  Administered Date(s) Administered  . Influenza Whole 10/22/2017  . Influenza, High Dose Seasonal PF 11/08/2015, 11/07/2016  . Influenza-Unspecified 01/21/2014, 10/29/2015  . Pneumococcal Conjugate-13 04/16/2017  . Pneumococcal Polysaccharide-23 01/21/2014   Pertinent  Health Maintenance Due  Topic Date Due  . INFLUENZA  VACCINE  Completed  . PNA vac Low Risk Adult  Completed   Fall Risk  04/18/2017  Falls in the past year? No   Functional Status Survey:    Vitals:   03/02/18 1615  BP: (!) 150/90  Pulse: 79  Resp: 16  Temp: 98.5 F (36.9 C)  SpO2: 96%  Weight: 189 lb 6.4 oz (85.9 kg)  Height: 5\' 8"  (1.727 m)   Body mass index is 28.8 kg/m. Physical Exam Constitutional:      General: He is not in acute distress.    Appearance: He is not ill-appearing, toxic-appearing or diaphoretic.     Comments: Over weight   HENT:     Head: Normocephalic and atraumatic.     Mouth/Throat:     Mouth: Mucous membranes are moist.  Eyes:     Extraocular Movements: Extraocular movements intact.     Pupils: Pupils are equal, round, and reactive to light.  Neck:     Musculoskeletal: Normal range of motion and neck supple.  Cardiovascular:     Rate and Rhythm: Normal rate and regular rhythm.     Heart sounds: No murmur.  Pulmonary:     Effort: Pulmonary effort is normal.     Breath sounds: No wheezing, rhonchi or rales.  Abdominal:     General: There is no distension.     Palpations: Abdomen is soft.     Tenderness: There is no abdominal tenderness. There is no guarding or rebound.  Musculoskeletal:     Right lower leg: Edema present.     Left lower leg: Edema present.     Comments: Trace edema BLE. No spinous process tenderness palpated in lumbar spine area. Ambulates with walker.   Skin:    General: Skin is warm and dry.  Neurological:     General: No focal deficit present.     Mental Status: He is alert. Mental status is at baseline.     Cranial Nerves: No cranial nerve deficit.     Motor: No weakness.     Coordination: Coordination normal.     Gait: Gait abnormal.     Comments: Oriented to person and his room on unit.   Psychiatric:        Mood and Affect: Mood normal.     Comments: Upon my visit     Labs reviewed: Recent Labs    06/17/17 11/11/17  NA 141 139  K 4.2 3.9  CL  --  106    CO2  --  25  BUN 18 18  CREATININE 1.2 0.8  CALCIUM 9.4 8.8   Recent Labs    06/17/17 11/11/17  AST 12* 16  ALT 12 15  ALKPHOS 88 67  PROT  --  5.8  ALBUMIN 4.1 3.6   Recent Labs    04/16/17 06/17/17 11/11/17  WBC 7.4 8.5 7.4  HGB 13.7 15.5 14.0  HCT 40* 45 41  PLT 146* 157 135*   Lab Results  Component Value Date   TSH 1.89 06/17/2017   Lab Results  Component Value Date   HGBA1C 5.4 02/20/2017   No results found  for: CHOL, HDL, LDLCALC, LDLDIRECT, TRIG, CHOLHDL  Significant Diagnostic Results in last 30 days:  No results found.  Assessment/Plan Essential hypertension Bp 150/45mmHgb today, will start on Metoprolol 12.5mg  bid. VS q shift.   Alzheimer's dementia with behavioral disturbance (HCC) Persists, continue SNF [facility] for safety and care assistance. Continue Galantamine 8mg  bid.   Major depression, recurrent, chronic (HCC) His mood is not stable, will taper off Wellbutrin 300mg  qod x 1 week, then dc. Update CBC/diff CMP. Starts Cymbalta 30mg  qd in setting of lower back pain. Observe.   Pain of back and left lower extremity Will increase Tylenol to 500mg  tid, adding Cymbalta 30mg  for mood and lower back pain. Observe.      Family/ staff Communication: plan of care reviewed with the patient and charge nurse.   Labs/tests ordered:  CBC/diff, CMP  Time spend 25 minutes

## 2018-03-02 NOTE — Assessment & Plan Note (Signed)
His mood is not stable, will taper off Wellbutrin 300mg  qod x 1 week, then dc. Update CBC/diff CMP. Starts Cymbalta 30mg  qd in setting of lower back pain. Observe.

## 2018-03-02 NOTE — Assessment & Plan Note (Signed)
Bp 150/34mmHgb today, will start on Metoprolol 12.5mg  bid. VS q shift.

## 2018-03-03 LAB — BASIC METABOLIC PANEL
BUN: 18 (ref 4–21)
CREATININE: 1 (ref <=1.3)
GLUCOSE: 102
POTASSIUM: 3.7 (ref 3.4–5.3)
Sodium: 139 (ref 137–147)

## 2018-03-03 LAB — HEPATIC FUNCTION PANEL
ALT: 13 (ref 10–40)
AST: 15 (ref 14–40)
Alkaline Phosphatase: 72 (ref 25–125)
Bilirubin, Total: 1.2

## 2018-03-03 LAB — CBC AND DIFFERENTIAL
HCT: 42 (ref 41–53)
Hemoglobin: 14.1 (ref 13.5–17.5)
Platelets: 152 (ref 150–399)
WBC: 7.1

## 2018-03-05 ENCOUNTER — Other Ambulatory Visit: Payer: Self-pay | Admitting: *Deleted

## 2018-03-05 LAB — COMPLETE METABOLIC PANEL WITH GFR
ALBUMIN: 3.8
CHLORIDE: 103
CO2: 28
Calcium: 8.8
GFR CALC NON AF AMER: 72
Globulin: 2.4
TOTAL PROTEIN: 6.2 g/dL

## 2018-03-23 ENCOUNTER — Non-Acute Institutional Stay (SKILLED_NURSING_FACILITY): Payer: Medicare Other | Admitting: Nurse Practitioner

## 2018-03-23 ENCOUNTER — Encounter: Payer: Self-pay | Admitting: Nurse Practitioner

## 2018-03-23 DIAGNOSIS — F339 Major depressive disorder, recurrent, unspecified: Secondary | ICD-10-CM | POA: Diagnosis not present

## 2018-03-23 DIAGNOSIS — F0280 Dementia in other diseases classified elsewhere without behavioral disturbance: Secondary | ICD-10-CM

## 2018-03-23 DIAGNOSIS — F0281 Dementia in other diseases classified elsewhere with behavioral disturbance: Secondary | ICD-10-CM | POA: Diagnosis not present

## 2018-03-23 DIAGNOSIS — F0282 Dementia in other diseases classified elsewhere, unspecified severity, with psychotic disturbance: Secondary | ICD-10-CM | POA: Insufficient documentation

## 2018-03-23 DIAGNOSIS — G309 Alzheimer's disease, unspecified: Secondary | ICD-10-CM

## 2018-03-23 DIAGNOSIS — F22 Delusional disorders: Secondary | ICD-10-CM

## 2018-03-23 NOTE — Assessment & Plan Note (Signed)
Continue SNF [facility] for safety and care assistance, continue Galantamine 8mg  bid for memory.

## 2018-03-23 NOTE — Assessment & Plan Note (Signed)
Mood is not controlled, delusional thoughts/false believes, will increase Cymbalta 60mg  po qd, continue Trazodone 25mg  qhs, may consider adding Seroquel 25mg  qd for augmentation treatment. Observe.
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
## 2018-03-23 NOTE — Assessment & Plan Note (Signed)
Will consider Seroquel 25mg  qd if no better after increased Cymbalta for better mood control. Observe.

## 2018-03-27 ENCOUNTER — Non-Acute Institutional Stay (SKILLED_NURSING_FACILITY): Payer: Medicare Other | Admitting: Nurse Practitioner

## 2018-03-27 ENCOUNTER — Other Ambulatory Visit: Payer: Self-pay | Admitting: *Deleted

## 2018-03-27 ENCOUNTER — Encounter: Payer: Self-pay | Admitting: Nurse Practitioner

## 2018-03-27 DIAGNOSIS — F0281 Dementia in other diseases classified elsewhere with behavioral disturbance: Secondary | ICD-10-CM | POA: Diagnosis not present

## 2018-03-27 DIAGNOSIS — F339 Major depressive disorder, recurrent, unspecified: Secondary | ICD-10-CM | POA: Diagnosis not present

## 2018-03-27 DIAGNOSIS — G309 Alzheimer's disease, unspecified: Secondary | ICD-10-CM | POA: Diagnosis not present

## 2018-03-27 DIAGNOSIS — F0280 Dementia in other diseases classified elsewhere without behavioral disturbance: Secondary | ICD-10-CM | POA: Diagnosis not present

## 2018-03-27 DIAGNOSIS — F22 Delusional disorders: Secondary | ICD-10-CM

## 2018-03-27 NOTE — Assessment & Plan Note (Addendum)
Will start Seroquel 25mg  qhs for delusion(false believes), depression, better night sleep. Will have Lorazepam 0.5mg  q8h prn available x 72 hours.

## 2018-03-27 NOTE — Assessment & Plan Note (Signed)
Continue SNF [facility] for safety and care assistance, continue Galantamine 8mg  bid.

## 2018-03-27 NOTE — Progress Notes (Signed)
Location:  [facility] Room Number: 28 Place of Service:  SNF (31) Provider:Damiana Berrian X,NP  Alianna Wurster X, NP  Patient Care Team: Ricca Melgarejo X, NP as PCP - General (Internal Medicine) Tyrihanna Wingert X, NP as Nurse Practitioner (Internal Medicine) Mahlon Gammon, MD as Consulting Physician (Internal Medicine)  Extended Emergency Contact Information Primary Emergency Contact: [facility] Address: [address]          [place],  [address] Home Phone: [phone] Relation: None  Code Status:DNR Goals of care: Advanced Directive information Advanced Directives 03/31/2018  Does Patient Have a Medical Advance Directive? Yes  Type of Estate agent of [place];Out of facility DNR (pink MOST or yellow form)  Does patient want to make changes to medical advance directive? No - Patient declined  Copy of Healthcare Power of Attorney in Chart? Yes - validated most recent copy scanned in chart (See row information)  Pre-existing out of facility DNR order (yellow form or pink MOST form) Yellow form placed in chart (order not valid for inpatient use)     Chief Complaint  Patient presents with  . Acute Visit    depression, delusion    HPI:  Pt is a 85 y.o. male seen today for escalated worries, depression, false believes(son took his woman, children took his money), pacing 3-4x/night, feeling being beterr off dead since attended  the funeral of his previous care giver. Presently he is taking Cymbalta 55m qd, Trazodone 25mg  qhs.  He resides in [facility] for safety and care assistance, on Galantamine 8mg  bid for memory.    Past Medical History:  Diagnosis Date  . Anxiety   . Arthritis   . BPH (benign prostatic hyperplasia)   . Dementia (HCC)   . GERD (gastroesophageal reflux disease)   . Hypertension   . IBS (irritable bowel syndrome)    Past Surgical History:  Procedure Laterality Date  . CHOLECYSTECTOMY    . EYE SURGERY      No Known  Allergies  Outpatient Encounter Medications as of 03/27/2018  Medication Sig  . acetaminophen (TYLENOL) 325 MG tablet Take 650 mg by mouth every 8 (eight) hours as needed for mild pain, moderate pain, fever or headache.   Marland Kitchen acetaminophen (TYLENOL) 500 MG tablet Take 500 mg by mouth 3 (three) times daily.   . DULoxetine (CYMBALTA) 60 MG capsule Take 60 mg by mouth daily.  . fexofenadine (ALLEGRA) 180 MG tablet Take 180 mg by mouth daily.  Marland Kitchen galantamine (RAZADYNE) 8 MG tablet Take 8 mg by mouth 2 (two) times daily with a meal.   . omeprazole (PRILOSEC) 20 MG capsule Take 20 mg by mouth daily.  . traZODone (DESYREL) 50 MG tablet Take 25 mg by mouth at bedtime.   . [DISCONTINUED] metoprolol tartrate (LOPRESSOR) 25 MG tablet Take 12.5 mg by mouth 2 (two) times daily.   . [DISCONTINUED] DULoxetine (CYMBALTA) 30 MG capsule Take 30 mg by mouth daily.   No facility-administered encounter medications on file as of 03/27/2018.    ROS was provided with assistance of staff Review of Systems  Constitutional: Positive for fatigue. Negative for activity change, appetite change, chills, diaphoresis and unexpected weight change.  HENT: Positive for hearing loss. Negative for voice change.   Respiratory: Negative for cough, shortness of breath and wheezing.   Cardiovascular: Positive for leg swelling. Negative for chest pain and palpitations.  Gastrointestinal: Negative for abdominal distention, abdominal pain, constipation, diarrhea, nausea and vomiting.  Genitourinary: Negative for difficulty  urinating, dysuria and urgency.  Musculoskeletal: Positive for arthralgias, back pain and gait problem.  Skin: Negative for color change and pallor.  Neurological: Positive for speech difficulty. Negative for dizziness, weakness and headaches.       Dementia, expressive aphasia.   Psychiatric/Behavioral: Positive for agitation, behavioral problems, confusion, dysphoric mood and sleep disturbance. Negative for  hallucinations. The patient is nervous/anxious.        Delusions    Immunization History  Administered Date(s) Administered  . Influenza Whole 10/22/2017  . Influenza, High Dose Seasonal PF 11/08/2015, 11/07/2016  . Influenza-Unspecified 01/21/2014, 10/29/2015  . Pneumococcal Conjugate-13 04/16/2017  . Pneumococcal Polysaccharide-23 01/21/2014   Pertinent  Health Maintenance Due  Topic Date Due  . INFLUENZA VACCINE  Completed  . PNA vac Low Risk Adult  Completed   Fall Risk  04/18/2017  Falls in the past year? No   Functional Status Survey:    Vitals:   03/27/18 1109  BP: 120/70  Pulse: 86  Resp: 20  Temp: 98.1 F (36.7 C)  SpO2: 95%  Weight: 186 lb 11.2 oz (84.7 kg)  Height: 5\' 8"  (1.727 m)   Body mass index is 28.39 kg/m. Physical Exam Constitutional:      Appearance: Normal appearance. He is normal weight. He is not ill-appearing, toxic-appearing or diaphoretic.     Comments: Over weight  HENT:     Head: Normocephalic and atraumatic.     Nose: Nose normal.     Mouth/Throat:     Mouth: Mucous membranes are moist.  Eyes:     Extraocular Movements: Extraocular movements intact.     Pupils: Pupils are equal, round, and reactive to light.  Neck:     Musculoskeletal: Normal range of motion and neck supple.  Cardiovascular:     Rate and Rhythm: Normal rate and regular rhythm.     Heart sounds: No murmur.  Pulmonary:     Effort: Pulmonary effort is normal.     Breath sounds: No wheezing, rhonchi or rales.  Abdominal:     Palpations: Abdomen is soft.     Tenderness: There is no abdominal tenderness. There is no guarding.     Hernia: No hernia is present.  Musculoskeletal:     Right lower leg: Edema present.     Left lower leg: Edema present.     Comments: Trace edema BLE. Ambulates with walker.   Skin:    General: Skin is warm and dry.  Neurological:     General: No focal deficit present.     Mental Status: He is alert. Mental status is at baseline.      Motor: No weakness.     Coordination: Coordination normal.     Gait: Gait abnormal.     Comments: Oriented to self.   Psychiatric:     Comments: Depressed mood     Labs reviewed: Recent Labs    11/11/17 03/03/18 03/29/18 2042  NA 139 139 138  K 3.9 3.7 3.9  CL 106 103 106  CO2 25 28 23   GLUCOSE  --   --  116*  BUN 18 18 21   CREATININE 0.8 1.0 0.96  CALCIUM 8.8 8.8 9.0   Recent Labs    11/11/17 03/03/18 03/29/18 2042  AST 16 15 19   ALT 15 13 18   ALKPHOS 67 72 70  BILITOT  --   --  0.9  PROT 5.8 6.2 7.2  ALBUMIN 3.6 3.8 4.0   Recent Labs    11/11/17 03/03/18 03/29/18  2042  WBC 7.4 7.1 10.3  NEUTROABS  --   --  6.5  HGB 14.0 14.1 15.6  HCT 41 42 48.1  MCV  --   --  104.3*  PLT 135* 152 143*   Lab Results  Component Value Date   TSH 1.89 06/17/2017   Lab Results  Component Value Date   HGBA1C 5.4 02/20/2017   No results found for: CHOL, HDL, LDLCALC, LDLDIRECT, TRIG, CHOLHDL  Significant Diagnostic Results in last 30 days:  Dg Chest 1 View  Result Date: 03/29/2018 CLINICAL DATA:  Suicidal ideation.  Altered mental status. EXAM: CHEST  1 VIEW COMPARISON:  August 08, 2014 FINDINGS: A moderate hiatal hernia is identified. The heart, hila, mediastinum, lungs, and pleura are otherwise unremarkable. IMPRESSION: Hiatal hernia.  No other abnormalities. 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 Will have Lorazepam 0.5mg  q8h prn available x 72 hours.   Alzheimer's dementia with behavioral disturbance (HCC) Continue SNF [facility] for safety and care assistance, continue Galantamine  bid.   Major depression, recurrent, chronic (HCC) Continue Cymbalta  qd, Trazodone  qhs, adding Seroquel  qd for augmentation of depression treatment.      Family/ staff  Communication:plan of care reviewed with the patient and charge nurse.   Labs/tests ordered: none  Time spend 25 minutes.

## 2018-03-27 NOTE — Assessment & Plan Note (Signed)
Continue Cymbalta 60mg  qd, Trazodone 25mg  qhs, adding Seroquel 25mg  qd for augmentation of depression treatment.

## 2018-03-29 ENCOUNTER — Emergency Department (HOSPITAL_COMMUNITY)
Admission: EM | Admit: 2018-03-29 | Discharge: 2018-03-29 | Disposition: A | Discharge status: Home / Self Care | Payer: Medicare Other | Attending: Emergency Medicine | Admitting: Emergency Medicine

## 2018-03-29 ENCOUNTER — Emergency Department (HOSPITAL_COMMUNITY): Payer: Medicare Other

## 2018-03-29 ENCOUNTER — Other Ambulatory Visit: Payer: Self-pay

## 2018-03-29 DIAGNOSIS — F0281 Dementia in other diseases classified elsewhere with behavioral disturbance: Secondary | ICD-10-CM | POA: Insufficient documentation

## 2018-03-29 DIAGNOSIS — R4689 Other symptoms and signs involving appearance and behavior: Secondary | ICD-10-CM

## 2018-03-29 DIAGNOSIS — Z79899 Other long term (current) drug therapy: Secondary | ICD-10-CM | POA: Diagnosis not present

## 2018-03-29 DIAGNOSIS — G309 Alzheimer's disease, unspecified: Secondary | ICD-10-CM | POA: Diagnosis not present

## 2018-03-29 DIAGNOSIS — R45851 Suicidal ideations: Secondary | ICD-10-CM | POA: Diagnosis not present

## 2018-03-29 DIAGNOSIS — I10 Essential (primary) hypertension: Secondary | ICD-10-CM | POA: Insufficient documentation

## 2018-03-29 DIAGNOSIS — R030 Elevated blood-pressure reading, without diagnosis of hypertension: Secondary | ICD-10-CM

## 2018-03-29 DIAGNOSIS — R451 Restlessness and agitation: Secondary | ICD-10-CM | POA: Diagnosis present

## 2018-03-29 LAB — CBC WITH DIFFERENTIAL/PLATELET
Abs Immature Granulocytes: 0.06 10*3/uL (ref 0.00–0.07)
Basophils Absolute: 0.1 10*3/uL (ref 0.0–0.1)
Basophils Relative: 1 %
EOS ABS: 0.3 10*3/uL (ref 0.0–0.5)
Eosinophils Relative: 2 %
HCT: 48.1 % (ref 39.0–52.0)
Hemoglobin: 15.6 g/dL (ref 13.0–17.0)
Immature Granulocytes: 1 %
Lymphocytes Relative: 24 %
Lymphs Abs: 2.5 10*3/uL (ref 0.7–4.0)
MCH: 33.8 pg (ref 26.0–34.0)
MCHC: 32.4 g/dL (ref 30.0–36.0)
MCV: 104.3 fL — ABNORMAL HIGH (ref 80.0–100.0)
Monocytes Absolute: 0.9 10*3/uL (ref 0.1–1.0)
Monocytes Relative: 9 %
Neutro Abs: 6.5 10*3/uL (ref 1.7–7.7)
Neutrophils Relative %: 63 %
Platelets: 143 10*3/uL — ABNORMAL LOW (ref 150–400)
RBC: 4.61 MIL/uL (ref 4.22–5.81)
RDW: 13.6 % (ref 11.5–15.5)
WBC: 10.3 10*3/uL (ref 4.0–10.5)
nRBC: 0 % (ref 0.0–0.2)

## 2018-03-29 LAB — COMPREHENSIVE METABOLIC PANEL
ALT: 18 U/L (ref 0–44)
AST: 19 U/L (ref 15–41)
Albumin: 4 g/dL (ref 3.5–5.0)
Alkaline Phosphatase: 70 U/L (ref 38–126)
Anion gap: 9 (ref 5–15)
BUN: 21 mg/dL (ref 8–23)
CO2: 23 mmol/L (ref 22–32)
Calcium: 9 mg/dL (ref 8.9–10.3)
Chloride: 106 mmol/L (ref 98–111)
Creatinine, Ser: 0.96 mg/dL (ref 0.61–1.24)
GFR calc Af Amer: >60 mL/min (ref >60)
GFR calc non Af Amer: >60 mL/min (ref >60)
Glucose, Bld: 116 mg/dL — ABNORMAL HIGH (ref 70–99)
POTASSIUM: 3.9 mmol/L (ref 3.5–5.1)
Sodium: 138 mmol/L (ref 135–145)
Total Bilirubin: 0.9 mg/dL (ref 0.3–1.2)
Total Protein: 7.2 g/dL (ref 6.5–8.1)

## 2018-03-29 LAB — CBG MONITORING, ED: GLUCOSE-CAPILLARY: 98 mg/dL (ref 70–99)

## 2018-03-29 LAB — URINALYSIS, ROUTINE W REFLEX MICROSCOPIC
Bilirubin Urine: NEGATIVE
Glucose, UA: NEGATIVE mg/dL
Hgb urine dipstick: NEGATIVE
Ketones, ur: NEGATIVE mg/dL
LEUKOCYTE UA: NEGATIVE
Nitrite: NEGATIVE
Protein, ur: NEGATIVE mg/dL
Specific Gravity, Urine: 1.021 (ref 1.005–1.030)
pH: 5 (ref 5.0–8.0)

## 2018-03-29 LAB — I-STAT TROPONIN, ED: Troponin i, poc: 0 ng/mL (ref 0.00–0.08)

## 2018-03-29 MED ORDER — METOPROLOL TARTRATE 25 MG PO TABS: ORAL_TABLET | ORAL | 0 refills | Status: DC

## 2018-03-29 NOTE — ED Notes (Signed)
Bed: [facility]4 Expected date:  Expected time:  Means of arrival:  Comments: 

## 2018-03-29 NOTE — Discharge Instructions (Signed)
Mr. Cirelli medical work-up is negative today.  It is very reassuring.  I spoke with his son, they would like him to return to the facility and continue his current regimen by NP Mast.   Per Mr. Klein evaluation today, we would recommend increasing his metoprolol to 25 mg in the morning and remain with 12.5 mg at night.  He should follow-up with his nurse practitioner in 7 to 10 days for recheck after this change.  Please bring Mr. Belisle back to the emergency department if he develops any further aggressive behavior presenting a danger to himself or others, he has a fever, or is appearing to have altered mental status.  Thank you for allowing [place] to participate in your care today.

## 2018-03-29 NOTE — ED Triage Notes (Addendum)
Per police: Pt is coming from friend's home on [address]. Pt reportedly was fighting the nurses and was agitated and stated he was going to kill himself.

## 2018-03-29 NOTE — ED Notes (Signed)
Attempted to get a urine sample pt was unable to provide a sample at this time.

## 2018-03-29 NOTE — ED Notes (Signed)
Patient has been calm and cooperative and denies wanting to hurt himself. Pt is sitting in his chair and talking to nurse calmly with some moderate confusion

## 2018-03-29 NOTE — ED Provider Notes (Signed)
[facility]-EMERGENCY DEPT Provider Note   CSN: [id number] Arrival date & time: 03/29/18  1856    History   Chief Complaint Chief Complaint  Patient presents with  . Agitation  . Suicidal    HPI Jose Nichols is a 85 y.o. male.     HPI  Patient is an 85 yo male with a history of dementia with behavioral disturbance, HTN, anxiety, arthritis presenting for behavioral disturbance   Further collateral information is obtained from patient's, RN at [facility] home.  According to RN, patient has been agitated over the past week above his baseline.  He is ruminating about self-harm and wanting to kill himself.  They have removed all dangerous objects from his room and initiated frequent safety checks on the patient.  Today he was more agitated and was threatening to run out into traffic.  They became concerned about patient safety and called [employer], however IVC was not taken out nor did family want IVC paperwork taken out.  RN reports that patient is typically confused, has nonsensical speech, but is calm and patient.  Collateral information obtained from patient's son, Brij Vanvactor.  He states that patient has made a number of concerning statements over the past week regarding wanted to kill himself and wanting to die.  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  Patient son reports that he saw his father today and he was agitated per his baseline, but did not seem significantly worse.  Patient did attend a funeral within the last week and patient son believes that this may have been triggering.  He also saw family members of the deceased person today, and believes that  they may have agitated the patient.  He states that he would like to see the patient to go back to [facility] home tonight and continue the current adjustments in his psychiatric medications that were made 2 days ago.  Past Medical History:  Diagnosis Date  . Anxiety   . Arthritis   . BPH (benign prostatic hyperplasia)   . Dementia (HCC)   . GERD (gastroesophageal reflux disease)   . Hypertension   . IBS (irritable bowel syndrome)     Patient Active Problem List   Diagnosis Date Noted  . 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 Weight gain 11/26/2017  . Pain of back and left lower extremity 07/09/2017  . HOH (hard of hearing) 06/18/2017  . Expressive aphasia 06/17/2017  . Gait disorder 06/12/2017  . Thrombocytopenia (HCC) 05/16/2017  . Incontinent of urine 04/15/2017  . Urinary frequency 04/15/2017  . Alzheimer's dementia with behavioral disturbance (HCC) 02/18/2017  . Major depression, recurrent, chronic (HCC) 02/18/2017  . Prediabetes 02/18/2017  . Allergic rhinitis 02/18/2017  . Essential hypertension 02/18/2017  . GERD (gastroesophageal reflux disease) 02/06/2017  . Insomnia 02/06/2017  . Dysphagia 02/06/2017    Past Surgical History:  Procedure Laterality Date  . CHOLECYSTECTOMY    . EYE SURGERY          Home Medications    Prior to Admission medications   Medication Sig Start Date End Date Taking? Authorizing Provider  acetaminophen (TYLENOL) 325 MG tablet Take 650 mg by mouth every 8 (  eight) hours as needed.    [provider]  acetaminophen (TYLENOL) 500 MG tablet Take 500 mg by mouth 3 (three) times daily.     [provider]  DULoxetine (CYMBALTA) 60 MG capsule Take 60 mg by mouth daily.    [provider]  fexofenadine (ALLEGRA) 180 MG tablet Take 180 mg by mouth daily.    [provider]  galantamine (RAZADYNE) 8 MG tablet Take 8 mg by mouth 2 (two) times daily with a meal.     [provider]  LORazepam (ATIVAN) 0.5 MG tablet Take 0.5 mg by mouth every 8 (eight) hours as needed for anxiety (x 72 hours).    [provider]  metoprolol tartrate (LOPRESSOR) 25 MG tablet Take 25 mg by mouth 2 (two) times daily.    [provider]  omeprazole (PRILOSEC) 20 MG capsule Take 20 mg by mouth daily.    [provider]  traZODone (DESYREL) 50 MG tablet Take 25 mg by mouth at bedtime.  05/28/16   [provider]    Family History Family History  Problem Relation Age of Onset  . Heart disease Father     Social History Social History   Tobacco Use  . Smoking status: Never Smoker  . Smokeless tobacco: Never Used  Substance Use Topics  . Alcohol use: No  . Drug use: No     Allergies   Patient has no known allergies.   Review of Systems Review of Systems  Constitutional: Negative for chills and fever.  Respiratory: Negative for chest tightness and shortness of breath.   Gastrointestinal: Negative for abdominal pain, nausea and vomiting.  Neurological: Negative for weakness and numbness.   Level 5 caveat dementia.  Physical Exam Updated Vital Signs BP (!) 148/92 (BP Location: Left Arm)   Pulse (!) 109   Temp 98.1 F (36.7 C)   Resp 18   SpO2 95%   Physical Exam Vitals signs and nursing note reviewed.  Constitutional:      General: He is not in acute distress.    Appearance: Normal appearance. He is well-developed. He is not ill-appearing or diaphoretic.     Comments: Comfortable, cooperative, and sitting in a chair in no acute distress.  HENT:     Head: Normocephalic and atraumatic.     Comments: Chronic, seborrheic keratoses of scalp.     Mouth/Throat:     Mouth: Mucous membranes are moist.  Eyes:     Conjunctiva/sclera: Conjunctivae normal.     Pupils: Pupils are equal, round, and reactive to light.  Neck:     Musculoskeletal: Normal range of motion and neck supple.  Cardiovascular:     Rate and Rhythm: Normal  rate and regular rhythm.     Heart sounds: S1 normal and S2 normal. No murmur.  Pulmonary:     Effort: Pulmonary effort is normal.     Breath sounds: Normal breath sounds. No wheezing or rales.  Abdominal:     General: There is no distension.     Palpations: Abdomen is soft.     Tenderness: There is no abdominal tenderness. There is no guarding.  Musculoskeletal: Normal range of motion.        General: No deformity.  Lymphadenopathy:     Cervical: No cervical adenopathy.  Skin:    General: Skin is warm and dry.     Findings: No erythema or rash.  Neurological:     Mental Status: He is alert.  Comments: Cranial nerves grossly intact. Patient moves extremities symmetrically and with good coordination.  Psychiatric:     Comments: Speech is clear but nonsensical.  Patient appears adamant about what he wants to say, but is unable to get across the point.  He does not appear agitated.  He is calm and collected.      ED Treatments / Results  Labs (all labs ordered are listed, but only abnormal results are displayed) Labs Reviewed  URINALYSIS, ROUTINE W REFLEX MICROSCOPIC    EKG None  Radiology No results found.  Procedures Procedures (including critical care time)  Medications Ordered in ED Medications - No data to display   Initial Impression / Assessment and Plan / ED Course  I have reviewed the triage vital signs and the nursing notes.  Pertinent labs & imaging results that were available during my care of the patient were reviewed by me and considered in my medical decision making (see chart for details).        Patient nontoxic-appearing in no acute distress PT initially had a tachycardic reading which resolved here in the emergency department.  He looks well, and has a nonfocal neurologic exam, and has no clear signs of infection.  He is not agitated and is not expressing any suicidal ideations on exam today.  Per discussion with patient's nurse at [facility]  home, and the patient's son, they would like a medical work-up on the patient, but if this is normal, he is stable for return to [facility] home tonight.  Work-up demonstrating no leukocytosis.  Urinalysis is negative for infection.  Normal electrolytes.  Chest x-ray without cardiopulmonary disease.  EKG with slight sinus tachycardia, but no evidence of arrhythmia, infarction, or arrhythmia.  Patient's medical work-up is negative.  Patient is stable for return to friend's home with continuation of his medications.  Will increase patient's metoprolol to 25 mg in the morning, and 12.5 mg nightly.  Recommend follow-up with primary care in 7 to 10 days. Follow-up plan communicated to [facility].  Patient son is comfortable with discharge plan.  This is a shared visit with Dr. Arby Barrette. Patient was independently evaluated by this attending physician. Attending physician consulted in evaluation and discharge management.  Final Clinical Impressions(s) / ED Diagnoses   Final diagnoses:  Change in behavior  Suicidal ideation    ED Discharge Orders         Ordered    metoprolol tartrate (LOPRESSOR) 25 MG tablet     03/29/18 2210           Delia Chimes 03/29/18 2211    Arby Barrette, MD 04/05/18 1644

## 2018-03-29 NOTE — ED Provider Notes (Signed)
Medical screening examination/treatment/procedure(s) were conducted as a shared visit with non-physician practitioner(s) and myself.  I personally evaluated the patient during the encounter.  None Patient alert and pleasantly interactive.  He has been sent to the emergency department for reports of suicidal commentary and agitated behavior.  Patient is alert and appropriate.  He is pleasant and interactive.  No respiratory distress.  Heart borderline tachycardic.  Lungs are clear.  Abdomen is soft.  Trace edema at the ankles calves soft and nontender.  Basic evaluation does not show signs of infection or etiology for any delirium.  This appears to be at baseline for patient's dementia.  This is been reviewed by Clelia Croft with the patient's son.  At this time plan will be to have the patient return to his nursing home provider.   Arby Barrette, MD 03/29/18 2206

## 2018-03-30 ENCOUNTER — Encounter: Payer: Self-pay | Admitting: Nurse Practitioner

## 2018-03-30 ENCOUNTER — Non-Acute Institutional Stay (SKILLED_NURSING_FACILITY): Payer: Medicare Other | Admitting: Nurse Practitioner

## 2018-03-30 DIAGNOSIS — F339 Major depressive disorder, recurrent, unspecified: Secondary | ICD-10-CM

## 2018-03-30 DIAGNOSIS — F0281 Dementia in other diseases classified elsewhere with behavioral disturbance: Secondary | ICD-10-CM | POA: Diagnosis not present

## 2018-03-30 DIAGNOSIS — G309 Alzheimer's disease, unspecified: Secondary | ICD-10-CM | POA: Diagnosis not present

## 2018-03-30 DIAGNOSIS — I10 Essential (primary) hypertension: Secondary | ICD-10-CM

## 2018-03-30 MED ORDER — LORAZEPAM 0.5 MG PO TABS: 0.5000 mg | ORAL_TABLET | Freq: Three times a day (TID) | ORAL | 0 refills | Status: DC | PRN

## 2018-03-30 NOTE — Assessment & Plan Note (Signed)
With suicidal ideation: continue Seroquel, Trazodone, Cymbalta, Lorazepam. Will monitor for suicidal ideation, exit seeking, delusional thoughts.

## 2018-03-30 NOTE — Assessment & Plan Note (Signed)
Blood pressure is controlled, continue Metoprolol 25mg qd.  

## 2018-03-30 NOTE — Progress Notes (Signed)
Location:  [facility] Room Number: [id number] Place of Service:  SNF (31) Provider:  Chipper Oman  NP  Rikia Sukhu X, NP  Patient Care Team: Khalel Alms X, NP as PCP - General (Internal Medicine) Makinlee Awwad X, NP as Nurse Practitioner (Internal Medicine)  Extended Emergency Contact Information Primary Emergency Contact: [facility] Address: [address]          [place],  [address] Home Phone: [phone] Relation: None  Code Status:  DNR Goals of care: Advanced Directive information Advanced Directives 03/29/2018  Does Patient Have a Medical Advance Directive? Yes  Type of Advance Directive Out of facility DNR (pink MOST or yellow form)  Does patient want to make changes to medical advance directive? -  Copy of Healthcare Power of Attorney in Chart? Yes - validated most recent copy scanned in chart (See row information)  Pre-existing out of facility DNR order (yellow form or pink MOST form) Yellow form placed in chart (order not valid for inpatient use)     Chief Complaint  Patient presents with  . Acute Visit    HPI:  Pt is a 85 y.o. male seen today for an acute visit for the patient resides in SNF [facility] for safety and care assistance, takes Galantamine for memory. Ed evaluation 03/30/18 for suicidal ideations. The reported behaviors 03/28/18 the patient stated no need to eat, I am going to hell today, doesn't appears to have any plans to harm self. 03/29/18 exit seeking, agitated, voicing plans to hurt self, stated I will kill myself, I need to get out of here to do it, my wife is dead, my son is no good, get out of my way, wanting ot be hit by a car. He is at his baseline of mentation and behaviors today. ED CBC/diff, CMP, UA, troponin I, EKG, CXR-are unremarkable. On Seroquel  qd, Trazodone  qhs, Cymbalta  qd, Lorazepam 0.5mg  q8h prn. Galantamine  bid. HTN, started Metoprolol  qd in ED, blood pressure is controlled today.    Past Medical History:    Diagnosis Date  . Anxiety   . Arthritis   . BPH (benign prostatic hyperplasia)   . Dementia (HCC)   . GERD (gastroesophageal reflux disease)   . Hypertension   . IBS (irritable bowel syndrome)    Past Surgical History:  Procedure Laterality Date  . CHOLECYSTECTOMY    . EYE SURGERY      No Known Allergies  Outpatient Encounter Medications as of 03/30/2018  Medication Sig  . acetaminophen (TYLENOL) 325 MG tablet Take 650 mg by mouth every 8 (eight) hours as needed for mild pain, moderate pain, fever or headache.   Marland Kitchen acetaminophen (TYLENOL) 500 MG tablet Take 500 mg by mouth 3 (three) times daily.   . DULoxetine (CYMBALTA) 60 MG capsule Take 60 mg by mouth daily.  . fexofenadine (ALLEGRA) 180 MG tablet Take 180 mg by mouth daily.  Marland Kitchen galantamine (RAZADYNE) 8 MG tablet Take 8 mg by mouth 2 (two) times daily with a meal.   . LORazepam (ATIVAN) 0.5 MG tablet Take 1 tablet (0.5 mg total) by mouth every 8 (eight) hours as needed for anxiety (x 72 hours).  . metoprolol tartrate (LOPRESSOR) 25 MG tablet Take 25 mg in the morning and 12.5 mg at night.  Marland Kitchen omeprazole (PRILOSEC) 20 MG capsule Take 20 mg by mouth daily.  . QUEtiapine (SEROQUEL) 25 MG tablet Take 25 mg by mouth at bedtime.  . traZODone (DESYREL) 50 MG tablet  Take 25 mg by mouth at bedtime.    No facility-administered encounter medications on file as of 03/30/2018.    ROS was provided with assistance of staff.  Review of Systems  Constitutional: Negative for activity change, appetite change, chills, diaphoresis, fatigue and fever.  HENT: Positive for hearing loss. Negative for congestion and voice change.   Respiratory: Negative for cough, shortness of breath and wheezing.   Cardiovascular: Positive for leg swelling. Negative for chest pain and palpitations.  Gastrointestinal: Negative for abdominal pain, constipation, diarrhea, nausea and vomiting.  Genitourinary: Negative for difficulty urinating, dysuria and urgency.   Musculoskeletal: Positive for arthralgias, back pain and gait problem.  Neurological: Negative for dizziness, speech difficulty, weakness and headaches.       Dementia  Psychiatric/Behavioral: Positive for behavioral problems, confusion and sleep disturbance. Negative for agitation and hallucinations. The patient is nervous/anxious.        Exit seeking, delusions.     Immunization History  Administered Date(s) Administered  . Influenza Whole 10/22/2017  . Influenza, High Dose Seasonal PF 11/08/2015, 11/07/2016  . Influenza-Unspecified 01/21/2014, 10/29/2015  . Pneumococcal Conjugate-13 04/16/2017  . Pneumococcal Polysaccharide-23 01/21/2014   Pertinent  Health Maintenance Due  Topic Date Due  . INFLUENZA VACCINE  Completed  . PNA vac Low Risk Adult  Completed   Fall Risk  04/18/2017  Falls in the past year? No   Functional Status Survey:    Vitals:   03/30/18 1158  BP: (!) 110/92  Pulse: 70  Resp: 20  Temp: 97.6 F (36.4 C)  SpO2: 96%  Weight: 186 lb 11.2 oz (84.7 kg)  Height: 5\' 8"  (1.727 m)   Body mass index is 28.39 kg/m. Physical Exam Vitals signs and nursing note reviewed.  Constitutional:      General: He is not in acute distress.    Appearance: Normal appearance. He is not ill-appearing, toxic-appearing or diaphoretic.  HENT:     Head: Normocephalic and atraumatic.     Nose: Nose normal.     Mouth/Throat:     Mouth: Mucous membranes are moist.  Eyes:     Extraocular Movements: Extraocular movements intact.     Pupils: Pupils are equal, round, and reactive to light.  Neck:     Musculoskeletal: Normal range of motion and neck supple.  Cardiovascular:     Rate and Rhythm: Normal rate and regular rhythm.     Heart sounds: No murmur.  Pulmonary:     Effort: Pulmonary effort is normal.     Breath sounds: No wheezing, rhonchi or rales.  Abdominal:     General: There is no distension.     Palpations: Abdomen is soft.     Tenderness: There is no abdominal  tenderness. There is no guarding or rebound.  Musculoskeletal:     Right lower leg: Edema present.     Left lower leg: Edema present.     Comments: Trace edema BLE. Ambulates with walker.   Skin:    General: Skin is warm and dry.  Neurological:     General: No focal deficit present.     Mental Status: He is alert. Mental status is at baseline.     Cranial Nerves: No cranial nerve deficit.     Motor: No weakness.     Coordination: Coordination normal.     Gait: Gait abnormal.     Comments: Oriented to self.   Psychiatric:        Mood and Affect: Mood normal.  Behavior: Behavior normal.     Comments: Upon my visit today.      Labs reviewed: Recent Labs    11/11/17 03/03/18 03/29/18 2042  NA 139 139 138  K 3.9 3.7 3.9  CL 106 103 106  CO2 25 28 23   GLUCOSE  --   --  116*  BUN 18 18 21   CREATININE 0.8 1.0 0.96  CALCIUM 8.8 8.8 9.0   Recent Labs    11/11/17 03/03/18 03/29/18 2042  AST 16 15 19   ALT 15 13 18   ALKPHOS 67 72 70  BILITOT  --   --  0.9  PROT 5.8 6.2 7.2  ALBUMIN 3.6 3.8 4.0   Recent Labs    11/11/17 03/03/18 03/29/18 2042  WBC 7.4 7.1 10.3  NEUTROABS  --   --  6.5  HGB 14.0 14.1 15.6  HCT 41 42 48.1  MCV  --   --  104.3*  PLT 135* 152 143*   Lab Results  Component Value Date   TSH 1.89 06/17/2017   Lab Results  Component Value Date   HGBA1C 5.4 02/20/2017   No results found for: CHOL, HDL, LDLCALC, LDLDIRECT, TRIG, CHOLHDL  Significant Diagnostic Results in last 30 days:  Dg Chest 1 View  Result Date: 03/29/2018 CLINICAL DATA:  Suicidal ideation.  Altered mental status. EXAM: CHEST  1 VIEW COMPARISON:  August 08, 2014 FINDINGS: A moderate hiatal hernia is identified. The heart, hila, mediastinum, lungs, and pleura are otherwise unremarkable. IMPRESSION: Hiatal hernia.  No other abnormalities. Electronically Signed   By: Gerome Sam III M.D   On: 03/29/2018 20:58    Assessment/Plan Major depression, recurrent, chronic (HCC) With  suicidal ideation: continue Seroquel, Trazodone, Cymbalta, Lorazepam. Will monitor for suicidal ideation, exit seeking, delusional thoughts.   Alzheimer's dementia with behavioral disturbance (HCC) Continue SNF [facility] for safety and care assistance. Continue Galantamine 8mg  bid for memory.   Essential hypertension Blood pressure is controlled, continue Metoprolol 25mg  qd.      Family/ staff Communication: plan of care reviewed with the patient and charge nurse.   Labs/tests ordered:  none  Time spend 25 minutes.

## 2018-03-30 NOTE — Assessment & Plan Note (Signed)
Continue SNF [facility] for safety and care assistance. Continue Galantamine 8mg  bid for memory.

## 2018-03-31 ENCOUNTER — Other Ambulatory Visit: Payer: Self-pay | Admitting: *Deleted

## 2018-03-31 ENCOUNTER — Encounter: Payer: Self-pay | Admitting: Internal Medicine

## 2018-03-31 ENCOUNTER — Non-Acute Institutional Stay (SKILLED_NURSING_FACILITY): Payer: Medicare Other | Admitting: Internal Medicine

## 2018-03-31 DIAGNOSIS — R4701 Aphasia: Secondary | ICD-10-CM

## 2018-03-31 DIAGNOSIS — F339 Major depressive disorder, recurrent, unspecified: Secondary | ICD-10-CM | POA: Diagnosis not present

## 2018-03-31 DIAGNOSIS — G309 Alzheimer's disease, unspecified: Secondary | ICD-10-CM | POA: Diagnosis not present

## 2018-03-31 DIAGNOSIS — I10 Essential (primary) hypertension: Secondary | ICD-10-CM | POA: Diagnosis not present

## 2018-03-31 DIAGNOSIS — F0281 Dementia in other diseases classified elsewhere with behavioral disturbance: Secondary | ICD-10-CM

## 2018-03-31 MED ORDER — LORAZEPAM 0.5 MG PO TABS: 0.5000 mg | ORAL_TABLET | Freq: Three times a day (TID) | ORAL | 0 refills | Status: DC | PRN

## 2018-03-31 NOTE — Progress Notes (Signed)
Location:  [facility] Room Number: 28 Place of Service:  SNF (31) Provider:Gupta Anjali L,MD  Mast, Man X, NP  Patient Care Team: Mast, Man X, NP as PCP - General (Internal Medicine) Mast, Man X, NP as Nurse Practitioner (Internal Medicine) Mahlon Gammon, MD as Consulting Physician (Internal Medicine)  Extended Emergency Contact Information Primary Emergency Contact: [facility] Address: [address]          [place],  [address] Home Phone: [phone] Relation: None  Code Status: DNR Goals of care: Advanced Directive information Advanced Directives 03/31/2018  Does Patient Have a Medical Advance Directive? Yes  Type of Estate agent of [place];Out of facility DNR (pink MOST or yellow form)  Does patient want to make changes to medical advance directive? No - Patient declined  Copy of Healthcare Power of Attorney in Chart? Yes - validated most recent copy scanned in chart (See row information)  Pre-existing out of facility DNR order (yellow form or pink MOST form) Yellow form placed in chart (order not valid for inpatient use)     Chief Complaint  Patient presents with  . Acute Visit    depression, Psyhosis    HPI:  Pt is a 85 y.o. male seen today for an acute visit for Dementia and psychosis with Depression  Patient has h/o Alzheimer's dementia, hypertension, GERD and Depression  Most of the history was obtained from reviewing the chart and talking to the DON and his Nurse. Patient had problems with major depression but recently he also started talking that he would does not want to live anymore . He also started having problems with psychosis.  He keeps saying that he wants to go home and wants to go to heaven.  Patient has not attempted  to harm to himself.   He did try escape the facility this weekend and was found by security in the parking lot Was also seen in ED after that.  His EKG chest x-ray troponins were all  negative.  He was also  started on Seroquel few days ago.  He continues on Trazodone ,Cymbalta and Ativan. Was pleasant with me . He said he wants to go away from here as they  Do not understand him. Has expressive Aphasia . Most of his words were jumbled . He could not name the objects. But was smiling and telling me about his family. He did say that his wife is not here and everyone else wants his money  Past Medical History:  Diagnosis Date  . Anxiety   . Arthritis   . BPH (benign prostatic hyperplasia)   . Dementia (HCC)   . GERD (gastroesophageal reflux disease)   . Hypertension   . IBS (irritable bowel syndrome)    Past Surgical History:  Procedure Laterality Date  . CHOLECYSTECTOMY    . EYE SURGERY      No Known Allergies  Outpatient Encounter Medications as of 03/31/2018  Medication Sig  . acetaminophen (TYLENOL) 325 MG tablet Take 650 mg by mouth every 8 (eight) hours as needed for mild pain, moderate pain, fever or headache.   Marland Kitchen acetaminophen (TYLENOL) 500 MG tablet Take 500 mg by mouth 3 (three) times daily.   . DULoxetine (CYMBALTA) 60 MG capsule Take 60 mg by mouth daily.  . fexofenadine (ALLEGRA) 180 MG tablet Take 180 mg by mouth daily.  Marland Kitchen galantamine (RAZADYNE) 8 MG tablet Take 8 mg by mouth 2 (two) times daily with a meal.   .  LORazepam (ATIVAN) 0.5 MG tablet Take 1 tablet (0.5 mg total) by mouth every 8 (eight) hours as needed for up to 14 days for anxiety.  . metoprolol tartrate (LOPRESSOR) 25 MG tablet Take 25 mg in the morning and 12.5 mg at night.  Marland Kitchen omeprazole (PRILOSEC) 20 MG capsule Take 20 mg by mouth daily.  . QUEtiapine (SEROQUEL) 25 MG tablet Take 25 mg by mouth at bedtime.  . traZODone (DESYREL) 50 MG tablet Take 25 mg by mouth at bedtime.    No facility-administered encounter medications on file as of 03/31/2018.     Review of Systems  Reason unable to perform ROS: He denied everything but not sure if he understands.    Immunization History    Administered Date(s) Administered  . Influenza Whole 10/22/2017  . Influenza, High Dose Seasonal PF 11/08/2015, 11/07/2016  . Influenza-Unspecified 01/21/2014, 10/29/2015  . Pneumococcal Conjugate-13 04/16/2017  . Pneumococcal Polysaccharide-23 01/21/2014   Pertinent  Health Maintenance Due  Topic Date Due  . INFLUENZA VACCINE  Completed  . PNA vac Low Risk Adult  Completed   Fall Risk  04/18/2017  Falls in the past year? No   Functional Status Survey:    Vitals:   03/31/18 1339  BP: 110/62  Pulse: 82  Resp: 20  Temp: 97.6 F (36.4 C)  SpO2: 96%  Weight: 186 lb 11.2 oz (84.7 kg)  Height: 5\' 8"  (1.727 m)   Body mass index is 28.39 kg/m. Physical Exam Vitals signs reviewed.  Constitutional:      Appearance: Normal appearance.  HENT:     Head: Normocephalic.     Nose: Nose normal.     Mouth/Throat:     Mouth: Mucous membranes are moist.     Pharynx: Oropharynx is clear.  Eyes:     Pupils: Pupils are equal, round, and reactive to light.  Neck:     Musculoskeletal: Neck supple.  Cardiovascular:     Rate and Rhythm: Normal rate. Rhythm irregular.     Pulses: Normal pulses.     Heart sounds: Normal heart sounds. No murmur.  Pulmonary:     Effort: Pulmonary effort is normal. No respiratory distress.     Breath sounds: Normal breath sounds. No wheezing.  Abdominal:     General: Abdomen is flat. Bowel sounds are normal. There is no distension.     Palpations: Abdomen is soft.     Tenderness: There is no abdominal tenderness.  Musculoskeletal:     Comments: Mild swelling Bilateral  Skin:    General: Skin is warm and dry.  Neurological:     General: No focal deficit present.     Mental Status: He is alert.     Comments: Was alert.Would Juggle words when I will ask him about his age or DOB. Follows all simple commands  Psychiatric:        Attention and Perception: He is inattentive.        Mood and Affect: Mood is depressed.        Behavior: Behavior is  hyperactive.        Cognition and Memory: Cognition is impaired.     Labs reviewed: Recent Labs    11/11/17 03/03/18 03/29/18 2042  NA 139 139 138  K 3.9 3.7 3.9  CL 106 103 106  CO2 25 28 23   GLUCOSE  --   --  116*  BUN 18 18 21   CREATININE 0.8 1.0 0.96  CALCIUM 8.8 8.8 9.0   Recent Labs  11/11/17 03/03/18 03/29/18 2042  AST 16 15 19   ALT 15 13 18   ALKPHOS 67 72 70  BILITOT  --   --  0.9  PROT 5.8 6.2 7.2  ALBUMIN 3.6 3.8 4.0   Recent Labs    11/11/17 03/03/18 03/29/18 2042  WBC 7.4 7.1 10.3  NEUTROABS  --   --  6.5  HGB 14.0 14.1 15.6  HCT 41 42 48.1  MCV  --   --  104.3*  PLT 135* 152 143*   Lab Results  Component Value Date   TSH 1.89 06/17/2017   Lab Results  Component Value Date   HGBA1C 5.4 02/20/2017   No results found for: CHOL, HDL, LDLCALC, LDLDIRECT, TRIG, CHOLHDL  Significant Diagnostic Results in last 30 days:  Dg Chest 1 View  Result Date: 03/29/2018 CLINICAL DATA:  Suicidal ideation.  Altered mental status. EXAM: CHEST  1 VIEW COMPARISON:  August 08, 2014 FINDINGS: A moderate hiatal hernia is identified. The heart, hila, mediastinum, lungs, and pleura are otherwise unremarkable. IMPRESSION: Hiatal hernia.  No other abnormalities. Electronically Signed   By: Gerome Sam III M.D   On: 03/29/2018 20:58    Assessment/Plan  Alzheimer's dementia with behavioral disturbance,Expressive aphasia and Psychosis Patient was diagnosed in 2018 in [facility] . His MRI done at that time showed  extensive generalized cortical atrophy with prominent bilateral hippocampal atrophy.  Patient is on Galantamine He is also on Trazodone and Seroquel which was just started And Ativan PRN Will start him on Depakote 125 mg PO BID for his Safety Will repeat BMP, CBC Hepatic Panel TSH and Vit B12 in 4 weeks  Major depression, recurrent Continue on Cymbalta  Essential hypertension Stable on Lopressor      Family/ staff Communication:   Labs/tests  ordered:   Total time spent in this patient care encounter was 25_ minutes; greater than 50% of the visit spent counseling patient, reviewing records , Labs and coordinating care for problems addressed at this encounter.

## 2018-04-08 ENCOUNTER — Encounter: Payer: Self-pay | Admitting: Nurse Practitioner

## 2018-04-08 ENCOUNTER — Non-Acute Institutional Stay (SKILLED_NURSING_FACILITY): Payer: Medicare Other | Admitting: Nurse Practitioner

## 2018-04-08 DIAGNOSIS — F0281 Dementia in other diseases classified elsewhere with behavioral disturbance: Secondary | ICD-10-CM

## 2018-04-08 DIAGNOSIS — S0083XA Contusion of other part of head, initial encounter: Secondary | ICD-10-CM | POA: Diagnosis not present

## 2018-04-08 DIAGNOSIS — K219 Gastro-esophageal reflux disease without esophagitis: Secondary | ICD-10-CM

## 2018-04-08 DIAGNOSIS — M549 Dorsalgia, unspecified: Secondary | ICD-10-CM

## 2018-04-08 DIAGNOSIS — W19XXXA Unspecified fall, initial encounter: Secondary | ICD-10-CM | POA: Diagnosis not present

## 2018-04-08 DIAGNOSIS — R269 Unspecified abnormalities of gait and mobility: Secondary | ICD-10-CM

## 2018-04-08 DIAGNOSIS — F339 Major depressive disorder, recurrent, unspecified: Secondary | ICD-10-CM | POA: Diagnosis not present

## 2018-04-08 DIAGNOSIS — G309 Alzheimer's disease, unspecified: Secondary | ICD-10-CM

## 2018-04-08 DIAGNOSIS — M79605 Pain in left leg: Secondary | ICD-10-CM

## 2018-04-08 NOTE — Assessment & Plan Note (Signed)
Left face, left lower lip, hands, knees sustained from a fall 04/08/18, continue closer supervision for safety, lack of safety awareness and increased frailty are contributory. Observe.

## 2018-04-08 NOTE — Assessment & Plan Note (Signed)
No pain complained today, continue Tylenol 500mg  tid.

## 2018-04-08 NOTE — Assessment & Plan Note (Signed)
Continue to reside in [facility] for safety and care assistance, continue Galantamine 8mg  bid for memory.

## 2018-04-08 NOTE — Assessment & Plan Note (Signed)
Intensive supervision needed.

## 2018-04-08 NOTE — Assessment & Plan Note (Addendum)
Continue to manage his mood, increase Quetiapine 50mg  qd, Trazodone 25mg  qd, Duloxetine 60mg  qd, Depakote 125mg  bid. Will have Lorazepam 0.5mg  q4h prn available x 2 weeks.

## 2018-04-08 NOTE — Progress Notes (Addendum)
Location:  [facility] Room Number: 28 Place of Service:  SNF (31) Provider:  Chipper Oman  NP  Nevia Henkin X, NP  Patient Care Team: Demontre Padin X, NP as PCP - General (Internal Medicine) Kegan Shepardson X, NP as Nurse Practitioner (Internal Medicine) Mahlon Gammon, MD as Consulting Physician (Internal Medicine)  Extended Emergency Contact Information Primary Emergency Contact: [facility] Address: [address]          [place],  [address] Home Phone: [phone] Relation: None  Code Status:  DNR Goals of care: Advanced Directive information Advanced Directives 03/31/2018  Does Patient Have a Medical Advance Directive? Yes  Type of Estate agent of [place];Out of facility DNR (pink MOST or yellow form)  Does patient want to make changes to medical advance directive? No - Patient declined  Copy of Healthcare Power of Attorney in Chart? Yes - validated most recent copy scanned in chart (See row information)  Pre-existing out of facility DNR order (yellow form or pink MOST form) Yellow form placed in chart (order not valid for inpatient use)     Chief Complaint  Patient presents with  . Medical Management of Chronic Issues    HPI:  Pt is a 85 y.o. male seen today for medical management of chronic diseases.    The patient resides in [facility] for safety and care assistance, on Galantamine 8mg  bid for memory. His mood is managed on Quetiapine 25mg  qd, Trazodone 25mg  qd, Duloxetine 60mg  qd, Depakote 125mg  bid, he is still impulsive, depressive statement-wants to die with no  plans, private sitter with him, not sleeping well at night, anxious to get out of the facility, skips meals.  Afib, heart rate is in control, on Metoprolol 25mg  qd, 12.5mg  qd. GERD stable on Omeprazole 20mg . Lower back pain, managed with Tylenol 500mg  tid.    Past Medical History:  Diagnosis Date  . Anxiety   . Arthritis   . BPH (benign prostatic hyperplasia)   .  Dementia (HCC)   . GERD (gastroesophageal reflux disease)   . Hypertension   . IBS (irritable bowel syndrome)    Past Surgical History:  Procedure Laterality Date  . CHOLECYSTECTOMY    . EYE SURGERY      No Known Allergies  Outpatient Encounter Medications as of 04/08/2018  Medication Sig  . acetaminophen (TYLENOL) 325 MG tablet Take 650 mg by mouth every 8 (eight) hours as needed for mild pain, moderate pain, fever or headache.   Marland Kitchen acetaminophen (TYLENOL) 500 MG tablet Take 500 mg by mouth 3 (three) times daily.   . divalproex (DEPAKOTE) 125 MG DR tablet Take 125 mg by mouth 2 (two) times daily.  . DULoxetine (CYMBALTA) 60 MG capsule Take 60 mg by mouth daily.  . fexofenadine (ALLEGRA) 180 MG tablet Take 180 mg by mouth daily.  Marland Kitchen galantamine (RAZADYNE) 8 MG tablet Take 8 mg by mouth 2 (two) times daily with a meal.   . LORazepam (ATIVAN) 0.5 MG tablet Take 1 tablet (0.5 mg total) by mouth every 8 (eight) hours as needed for up to 14 days for anxiety.  . metoprolol tartrate (LOPRESSOR) 25 MG tablet Take 25 mg in the morning and 12.5 mg at night.  Marland Kitchen omeprazole (PRILOSEC) 20 MG capsule Take 20 mg by mouth daily.  . QUEtiapine (SEROQUEL) 25 MG tablet Take 25 mg by mouth at bedtime.  . traZODone (DESYREL) 50 MG tablet Take 25 mg by mouth at bedtime.  No facility-administered encounter medications on file as of 04/08/2018.    ROS was provided with assistance of staff.  Review of Systems  Constitutional: Negative for activity change, appetite change, chills, diaphoresis, fatigue, fever and unexpected weight change.  HENT: Positive for hearing loss. Negative for congestion and voice change.   Respiratory: Negative for cough, shortness of breath and wheezing.   Cardiovascular: Positive for leg swelling. Negative for chest pain and palpitations.  Gastrointestinal: Negative for abdominal distention, abdominal pain, constipation, diarrhea, nausea and vomiting.  Genitourinary: Negative for  difficulty urinating, dysuria and urgency.  Musculoskeletal: Positive for back pain and gait problem.  Skin: Negative for color change and pallor.       Bruise left face, hands.  Neurological: Positive for speech difficulty. Negative for dizziness, tremors, facial asymmetry, weakness, light-headedness, numbness and headaches.       Dementia, expression aphasia.   Psychiatric/Behavioral: Positive for confusion and sleep disturbance. Negative for agitation, behavioral problems and hallucinations. The patient is nervous/anxious.     Immunization History  Administered Date(s) Administered  . Influenza Whole 10/22/2017  . Influenza, High Dose Seasonal PF 11/08/2015, 11/07/2016  . Influenza-Unspecified 01/21/2014, 10/29/2015  . Pneumococcal Conjugate-13 04/16/2017  . Pneumococcal Polysaccharide-23 01/21/2014   Pertinent  Health Maintenance Due  Topic Date Due  . INFLUENZA VACCINE  Completed  . PNA vac Low Risk Adult  Completed   Fall Risk  04/18/2017  Falls in the past year? No   Functional Status Survey:    Vitals:   04/08/18 1208  BP: 130/70  Pulse: 72  Resp: 20  Temp: 97.6 F (36.4 C)  SpO2: 96%  Weight: 186 lb 11.2 oz (84.7 kg)  Height:  (1.727 m)   Body mass index is 28.39 kg/m. Physical Exam Constitutional:      General: He is not in acute distress.    Appearance: Normal appearance. He is not ill-appearing, toxic-appearing or diaphoretic.     Comments: Over weight.   HENT:     Head: Normocephalic and atraumatic.     Nose: Nose normal.     Mouth/Throat:     Mouth: Mucous membranes are moist.  Eyes:     Conjunctiva/sclera: Conjunctivae normal.     Pupils: Pupils are equal, round, and reactive to light.  Neck:     Musculoskeletal: Normal range of motion and neck supple.  Cardiovascular:     Rate and Rhythm: Normal rate. Rhythm irregular.     Heart sounds: No murmur.  Pulmonary:     Effort: Pulmonary effort is normal.     Breath sounds: No wheezing,  rhonchi or rales.  Abdominal:     General: There is no distension.     Palpations: Abdomen is soft.     Tenderness: There is no abdominal tenderness. There is no guarding or rebound.  Skin:    General: Skin is warm and dry.     Findings: Bruising present.     Comments: Bruises left face, left lower lip, knees, hands.   Neurological:     General: No focal deficit present.     Mental Status: He is alert. Mental status is at baseline.     Cranial Nerves: No cranial nerve deficit.     Motor: No weakness.     Coordination: Coordination normal.     Gait: Gait abnormal.     Comments: Oriented to self.   Psychiatric:        Attention and Perception: He is inattentive.  Mood and Affect: Mood is anxious.        Speech: Speech is tangential.        Behavior: Behavior is hyperactive.        Thought Content: Thought content is delusional.        Cognition and Memory: Cognition is impaired.        Judgment: Judgment is impulsive and inappropriate.     Labs reviewed: Recent Labs    11/11/17 03/03/18 03/29/18 2042  NA 139 139 138  K 3.9 3.7 3.9  CL 106 103 106  CO2 25 28 23   GLUCOSE  --   --  116*  BUN 18 18 21   CREATININE 0.8 1.0 0.96  CALCIUM 8.8 8.8 9.0   Recent Labs    11/11/17 03/03/18 03/29/18 2042  AST 16 15 19   ALT 15 13 18   ALKPHOS 67 72 70  BILITOT  --   --  0.9  PROT 5.8 6.2 7.2  ALBUMIN 3.6 3.8 4.0   Recent Labs    11/11/17 03/03/18 03/29/18 2042  WBC 7.4 7.1 10.3  NEUTROABS  --   --  6.5  HGB 14.0 14.1 15.6  HCT 41 42 48.1  MCV  --   --  104.3*  PLT 135* 152 143*   Lab Results  Component Value Date   TSH 1.89 06/17/2017   Lab Results  Component Value Date   HGBA1C 5.4 02/20/2017   No results found for: CHOL, HDL, LDLCALC, LDLDIRECT, TRIG, CHOLHDL  Significant Diagnostic Results in last 30 days:  Dg Chest 1 View  Result Date: 03/29/2018 CLINICAL DATA:  Suicidal ideation.  Altered mental status. EXAM: CHEST  1 VIEW COMPARISON:  August 08, 2014  FINDINGS: A moderate hiatal hernia is identified. The heart, hila, mediastinum, lungs, and pleura are otherwise unremarkable. IMPRESSION: Hiatal hernia.  No other abnormalities. Electronically Signed   By: Gerome Sam III M.D   On: 03/29/2018 20:58    Assessment/Plan Contusion of face Left face, left lower lip, hands, knees sustained from a fall 04/08/18, continue closer supervision for safety, lack of safety awareness and increased frailty are contributory. Observe.   Fall Intensive supervision needed.   Gait disorder Continue to ambulate with walker, w/c as needed.   Major depression, recurrent, chronic (HCC) Continue to manage his mood, increase Quetiapine 50mg  qd, Trazodone 25mg  qd, Duloxetine 60mg  qd, Depakote 125mg  bid. Will have Lorazepam 0.5mg  q4h prn available x 2 weeks.   Alzheimer's dementia with behavioral disturbance (HCC) Continue to reside in [facility] for safety and care assistance, continue Galantamine 8mg  bid for memory.  GERD (gastroesophageal reflux disease) Stable, continue Omeprazole 20mg  qd.   Pain of back and left lower extremity No pain complained today, continue Tylenol 500mg  tid.      Family/ staff Communication: plan of care reviewed with the patient and charge nurse.   Labs/tests ordered:  none  Time spend 25 minutes.

## 2018-04-08 NOTE — Assessment & Plan Note (Signed)
Stable, continue Omeprazole 20mg qd.  

## 2018-04-08 NOTE — Assessment & Plan Note (Signed)
Continue to ambulate with walker, w/c as needed.

## 2018-04-14 ENCOUNTER — Non-Acute Institutional Stay (SKILLED_NURSING_FACILITY): Payer: Medicare Other | Admitting: Nurse Practitioner

## 2018-04-14 ENCOUNTER — Encounter: Payer: Self-pay | Admitting: Nurse Practitioner

## 2018-04-14 DIAGNOSIS — G309 Alzheimer's disease, unspecified: Secondary | ICD-10-CM

## 2018-04-14 DIAGNOSIS — F339 Major depressive disorder, recurrent, unspecified: Secondary | ICD-10-CM | POA: Diagnosis not present

## 2018-04-14 DIAGNOSIS — F0281 Dementia in other diseases classified elsewhere with behavioral disturbance: Secondary | ICD-10-CM | POA: Diagnosis not present

## 2018-04-14 NOTE — Progress Notes (Addendum)
Location:  [facility] Room Number: 28 Place of Service:  SNF (31) Provider:  Marlana Latus  NP  Leeba Barbe X, NP  Patient Care Team: Rylyn Zawistowski X, NP as PCP - General (Internal Medicine) Tiphany Fayson X, NP as Nurse Practitioner (Internal Medicine) Virgie Dad, MD as Consulting Physician (Internal Medicine)  Extended Emergency Contact Information Primary Emergency Contact: [facility] Address: [address]          [place],  [place] Home Phone: [phone] Relation: None  Code Status: DNR Goals of care: Advanced Directive information Advanced Directives 03/31/2018  Does Patient Have a Medical Advance Directive? Yes  Type of Paramedic of [place];Out of facility DNR (pink MOST or yellow form)  Does patient want to make changes to medical advance directive? No - Patient declined  Copy of [place] in Chart? Yes - validated most recent copy scanned in chart (See row information)  Pre-existing out of facility DNR order (yellow form or pink MOST form) Yellow form placed in chart (order not valid for inpatient use)     Chief Complaint  Patient presents with   Acute Visit    C/o- persistant depression, anxiety, agitation    HPI:  Pt is a 85 y.o. male seen today for an acute visit for reported packing, easily redirected 04/14/18. Reported anxious and combative, hitting his head against the window, beating his hands on table, able to be redirected after Ativan and sitter's help around 4pm 04/13/18. Reported the patient threat to leave facility, became combative/hostile, stated he wants to leave facility and get hit by a car, he doesn't want to live anymore around 5pm 04/11/08. Currently the patient is on Trazodone 60m since 10/23/17, Cymbalta 647msince 03/24/18, Depakote 125036mid, 03/31/18, Seroquel 23m32m prn Lorazepam 0.5mg 83m prn since 04/10/18. Hx of dementia, on Galantamine 8mg b81mfor memory, [facility]    Past Medical History:  Diagnosis Date   Anxiety    Arthritis    BPH (benign prostatic hyperplasia)    Dementia (HCC)    GERD (gastroesophageal reflux disease)    Hypertension    IBS (irritable bowel syndrome)    Past Surgical History:  Procedure Laterality Date   CHOLECYSTECTOMY     EYE SURGERY      No Known Allergies  Outpatient Encounter Medications as of 04/14/2018  Medication Sig   acetaminophen (TYLENOL) 325 MG tablet Take 650 mg by mouth every 8 (eight) hours as needed for mild pain, moderate pain, fever or headache.    acetaminophen (TYLENOL) 500 MG tablet Take 500 mg by mouth 3 (three) times daily.    divalproex (DEPAKOTE) 125 MG DR tablet Take 125 mg by mouth 2 (two) times daily.   DULoxetine (CYMBALTA) 60 MG capsule Take 60 mg by mouth daily.   fexofenadine (ALLEGRA) 180 MG tablet Take 180 mg by mouth daily.   galantamine (RAZADYNE) 8 MG tablet Take 8 mg by mouth 2 (two) times daily with a meal.    LORazepam (ATIVAN) 0.5 MG tablet Take 0.5 mg by mouth every 4 (four) hours as needed for anxiety.   metoprolol tartrate (LOPRESSOR) 25 MG tablet Take 25 mg in the morning and 12.5 mg at night.   metoprolol tartrate (LOPRESSOR) 25 MG tablet Take 25 mg by mouth daily.   omeprazole (PRILOSEC) 20 MG capsule Take 20 mg by mouth daily.   QUEtiapine (SEROQUEL) 50 MG tablet Take  50 mg by mouth daily.   traZODone (DESYREL) 50 MG tablet Take 25 mg by mouth at bedtime.    [DISCONTINUED] LORazepam (ATIVAN) 0.5 MG tablet Take 1 tablet (0.5 mg total) by mouth every 8 (eight) hours as needed for up to 14 days for anxiety.   [DISCONTINUED] QUEtiapine (SEROQUEL) 25 MG tablet Take 25 mg by mouth at bedtime.   No facility-administered encounter medications on file as of 04/14/2018.    ROS was provided with assistance of staff Review of Systems  Constitutional: Negative for activity change, appetite change, chills, diaphoresis, fatigue  and fever.  HENT: Positive for hearing loss. Negative for congestion and voice change.   Respiratory: Negative for cough, shortness of breath and wheezing.   Cardiovascular: Positive for leg swelling.  Gastrointestinal: Negative for abdominal distention, abdominal pain, constipation, diarrhea, nausea and vomiting.  Genitourinary: Negative for difficulty urinating, dysuria and urgency.  Musculoskeletal: Positive for back pain and gait problem.  Skin: Negative for color change and pallor.  Neurological: Positive for speech difficulty. Negative for dizziness, facial asymmetry, weakness and headaches.       Dementia, expressive aphasia.   Psychiatric/Behavioral: Positive for agitation, behavioral problems, confusion and dysphoric mood. Negative for hallucinations and sleep disturbance. The patient is nervous/anxious.     Immunization History  Administered Date(s) Administered   Influenza Whole 10/22/2017   Influenza, High Dose Seasonal PF 11/08/2015, 11/07/2016   Influenza-Unspecified 01/21/2014, 10/29/2015   Pneumococcal Conjugate-13 04/16/2017   Pneumococcal Polysaccharide-23 01/21/2014   Pertinent  Health Maintenance Due  Topic Date Due   INFLUENZA VACCINE  Completed   PNA vac Low Risk Adult  Completed   Fall Risk  04/18/2017  Falls in the past year? No   Functional Status Survey:    Vitals:   04/14/18 1221  BP: 134/80  Pulse: 70  Resp: 18  Temp: 97.8 F (36.6 C)  SpO2: 92%  Weight: 186 lb 11.2 oz (84.7 kg)  Height: '5\' 8"'  (1.727 m)   Body mass index is 28.39 kg/m. Physical Exam Vitals signs and nursing note reviewed.  Constitutional:      General: He is not in acute distress.    Appearance: Normal appearance. He is not ill-appearing, toxic-appearing or diaphoretic.  HENT:     Head: Normocephalic and atraumatic.     Nose: Nose normal.     Mouth/Throat:     Mouth: Mucous membranes are moist.  Eyes:     Extraocular Movements: Extraocular movements intact.      Conjunctiva/sclera: Conjunctivae normal.     Pupils: Pupils are equal, round, and reactive to light.  Neck:     Musculoskeletal: Normal range of motion and neck supple.  Cardiovascular:     Rate and Rhythm: Normal rate and regular rhythm.     Heart sounds: No murmur.  Pulmonary:     Effort: Pulmonary effort is normal.     Breath sounds: No wheezing, rhonchi or rales.  Abdominal:     General: There is no distension.     Palpations: Abdomen is soft.     Tenderness: There is no abdominal tenderness. There is no guarding or rebound.  Musculoskeletal:     Right lower leg: Edema present.     Left lower leg: Edema present.     Comments: Trace edema BLE. Ambulates with walker.   Skin:    General: Skin is warm and dry.     Findings: Bruising present.     Comments: Skin tear left hand, well approximated with  steri strips, no s/s of infection. Left hand bruise.  Neurological:     General: No focal deficit present.     Mental Status: He is alert. Mental status is at baseline.     Motor: No weakness.     Coordination: Coordination normal.     Gait: Gait abnormal.     Comments: Oriented to self.   Psychiatric:        Attention and Perception: He does not perceive auditory or visual hallucinations.        Mood and Affect: Mood is anxious and depressed. Affect is not labile, angry or tearful.        Speech: Speech is tangential. Speech is not rapid and pressured or slurred.        Behavior: Behavior is agitated. Behavior is not slowed, aggressive, withdrawn or combative.        Thought Content: Thought content is not paranoid or delusional. Thought content includes suicidal ideation. Thought content does not include homicidal or suicidal plan.        Cognition and Memory: Cognition is impaired. Memory is impaired.        Judgment: Judgment is impulsive and inappropriate.     Labs reviewed: Recent Labs    11/11/17 03/03/18 03/29/18 2042  NA 139 139 138  K 3.9 3.7 3.9  CL 106 103 106    CO2 '25 28 23  ' GLUCOSE  --   --  116*  BUN '18 18 21  ' CREATININE 0.8 1.0 0.96  CALCIUM 8.8 8.8 9.0   Recent Labs    11/11/17 03/03/18 03/29/18 2042  AST '16 15 19  ' ALT '15 13 18  ' ALKPHOS 67 72 70  BILITOT  --   --  0.9  PROT 5.8 6.2 7.2  ALBUMIN 3.6 3.8 4.0   Recent Labs    11/11/17 03/03/18 03/29/18 2042  WBC 7.4 7.1 10.3  NEUTROABS  --   --  6.5  HGB 14.0 14.1 15.6  HCT 41 42 48.1  MCV  --   --  104.3*  PLT 135* 152 143*   Lab Results  Component Value Date   TSH 1.89 06/17/2017   Lab Results  Component Value Date   HGBA1C 5.4 02/20/2017   No results found for: CHOL, HDL, LDLCALC, LDLDIRECT, TRIG, CHOLHDL  Significant Diagnostic Results in last 30 days:  Dg Chest 1 View  Result Date: 03/29/2018 CLINICAL DATA:  Suicidal ideation.  Altered mental status. EXAM: CHEST  1 VIEW COMPARISON:  August 08, 2014 FINDINGS: A moderate hiatal hernia is identified. The heart, hila, mediastinum, lungs, and pleura are otherwise unremarkable. IMPRESSION: Hiatal hernia.  No other abnormalities. Electronically Signed   By: Dorise Bullion III M.D   On: 03/29/2018 20:58    Assessment/Plan Alzheimer's dementia with behavioral disturbance ([place]) Combative, hostile, agitation, will increase Depakote 257m bid po. Continue prn Lorazepam. Continue SNF [facility], continue private sitter, Galantamine 859mbid. The plan of treatment was discussed with and advised by Dr. GuLyndel SafeWill update CBC/diff, CMP/eGFR, valproic acid level 4 weeks.    Major depression, recurrent, chronic ([place] HorseLast statement: wants to leave facility and get hit by a care, doesn't want live anymore 04/12/18, 2 days ago. Will continue Symbalta, Serroquel, Trazodone, prn Lorazepam.      Family/ staff Communication: plan of care reviewed with the patient and charge nurse.  Labs/tests ordered:  CBC/diff, CMP/eGFR, valproic acid level 4 weeks.   Time spend 25 minutes.

## 2018-04-14 NOTE — Assessment & Plan Note (Signed)
Last statement: wants to leave facility and get hit by a care, doesn't want live anymore 04/12/18, 2 days ago. Will continue Symbalta, Serroquel, Trazodone, prn Lorazepam.

## 2018-04-14 NOTE — Assessment & Plan Note (Addendum)
Combative, hostile, agitation, will increase Depakote 245m bid po. Continue prn Lorazepam. Continue SNF [facility], continue private sitter, Galantamine 835mbid. The plan of treatment was discussed with and advised by Dr. GuLyndel SafeWill update CBC/diff, CMP/eGFR, valproic acid level 4 weeks.

## 2018-04-20 ENCOUNTER — Other Ambulatory Visit: Payer: Self-pay

## 2018-04-20 MED ORDER — LORAZEPAM 0.5 MG PO TABS: 0.5000 mg | ORAL_TABLET | ORAL | 0 refills | Status: DC | PRN

## 2018-04-20 NOTE — Telephone Encounter (Signed)
Received fax from [facility] for refill.  

## 2018-04-24 ENCOUNTER — Non-Acute Institutional Stay (SKILLED_NURSING_FACILITY): Payer: Medicare Other | Admitting: Nurse Practitioner

## 2018-04-24 ENCOUNTER — Encounter: Payer: Self-pay | Admitting: Nurse Practitioner

## 2018-04-24 DIAGNOSIS — S61419A Laceration without foreign body of unspecified hand, initial encounter: Secondary | ICD-10-CM | POA: Insufficient documentation

## 2018-04-24 DIAGNOSIS — S51812A Laceration without foreign body of left forearm, initial encounter: Secondary | ICD-10-CM

## 2018-04-24 DIAGNOSIS — M549 Dorsalgia, unspecified: Secondary | ICD-10-CM

## 2018-04-24 DIAGNOSIS — G309 Alzheimer's disease, unspecified: Secondary | ICD-10-CM

## 2018-04-24 DIAGNOSIS — F0281 Dementia in other diseases classified elsewhere with behavioral disturbance: Secondary | ICD-10-CM

## 2018-04-24 DIAGNOSIS — F339 Major depressive disorder, recurrent, unspecified: Secondary | ICD-10-CM | POA: Diagnosis not present

## 2018-04-24 DIAGNOSIS — M79605 Pain in left leg: Secondary | ICD-10-CM

## 2018-04-24 DIAGNOSIS — W19XXXA Unspecified fall, initial encounter: Secondary | ICD-10-CM

## 2018-04-24 DIAGNOSIS — K219 Gastro-esophageal reflux disease without esophagitis: Secondary | ICD-10-CM | POA: Diagnosis not present

## 2018-04-24 DIAGNOSIS — S41119A Laceration without foreign body of unspecified upper arm, initial encounter: Secondary | ICD-10-CM

## 2018-04-24 DIAGNOSIS — I10 Essential (primary) hypertension: Secondary | ICD-10-CM | POA: Diagnosis not present

## 2018-04-24 NOTE — Assessment & Plan Note (Signed)
Stable, continue Omeprazole 20mg qd.  

## 2018-04-24 NOTE — Assessment & Plan Note (Signed)
Continue Quetiapine 50mg  qd, Depakote 250mg  bid, Cymbalta 60mg  qd. Dc Ativan-little efficacy. Will have Haldol 2.5mg  q8h prn x 14 days available to him. Observe.

## 2018-04-24 NOTE — Assessment & Plan Note (Signed)
Lack of safety awareness, increased frailty are contributory, continue memory care unit for safety and care assistance, close supervision for safety.

## 2018-04-24 NOTE — Assessment & Plan Note (Signed)
Blood pressure is controlled, continue Metoprolol 12.5mg  qd, 25mg  qd.

## 2018-04-24 NOTE — Assessment & Plan Note (Signed)
Dorsum left hand, left forearm, no s/s of infection, it should heal.

## 2018-04-24 NOTE — Assessment & Plan Note (Signed)
Continue memory care unit for safety and care assistance, may dc Galantamine-no longer beneficial in preserving memory is POA consents.

## 2018-04-24 NOTE — Assessment & Plan Note (Signed)
Stable, continue Tylenol 500mg  tid, ambulates with walker.

## 2018-04-24 NOTE — Progress Notes (Signed)
Location:  [facility] Room Number: 101 Place of Service:  SNF (31) Provider: Chipper Oman  NP  Mast, Man X, NP  Patient Care Team: Mast, Man X, NP as PCP - General (Internal Medicine) Mast, Man X, NP as Nurse Practitioner (Internal Medicine) Mahlon Gammon, MD as Consulting Physician (Internal Medicine)  Extended Emergency Contact Information Primary Emergency Contact: [facility] Address: [address]          [place],  [address] Home Phone: [phone] Relation: None  Code Status:  DNR Goals of care: Advanced Directive information Advanced Directives 04/24/2018  Does Patient Have a Medical Advance Directive? Yes  Type of Estate agent of [place];Out of facility DNR (pink MOST or yellow form)  Does patient want to make changes to medical advance directive? No - Patient declined  Copy of Healthcare Power of Attorney in Chart? Yes - validated most recent copy scanned in chart (See row information)  Pre-existing out of facility DNR order (yellow form or pink MOST form) Yellow form placed in chart (order not valid for inpatient use)     Chief Complaint  Patient presents with  . Acute Visit    C/o- Agitation    HPI:  Pt is a 85 y.o. male seen today for an acute visit for the patient had escalated agitation during his moving into Memory care unit [facility], sustained skin tear back of the left hand, left fore arm, bit mark R wrist by himself during the fall and combative/agitation episodes. ? Ativan with no efficacy. Haldol 2.5mg  x1 was effective. Hx of advanced stage of dementia, on Galantamine. Mood is managed with Cymbalta 60mg  qd, Trazodone 25 mg qd, Quetiapine 50mg  qd, Depakote 250mg  bid. GERD stable on Omeprazole 20mg  qd. HTN, blood pressure is controlled on Metoprolol 12.5mg  qd, 25mg  qd. Lower back pain is managed with Tylenol 500mg  tid.    Past Medical History:  Diagnosis Date  . Anxiety   . Arthritis   . BPH (benign  prostatic hyperplasia)   . Dementia (HCC)   . GERD (gastroesophageal reflux disease)   . Hypertension   . IBS (irritable bowel syndrome)    Past Surgical History:  Procedure Laterality Date  . CHOLECYSTECTOMY    . EYE SURGERY      No Known Allergies  Outpatient Encounter Medications as of 04/24/2018  Medication Sig  . acetaminophen (TYLENOL) 325 MG tablet Take 650 mg by mouth every 8 (eight) hours as needed for mild pain, moderate pain, fever or headache.   Marland Kitchen acetaminophen (TYLENOL) 500 MG tablet Take 500 mg by mouth 3 (three) times daily.   . divalproex (DEPAKOTE) 250 MG DR tablet Take 250 mg by mouth 2 (two) times daily.  . DULoxetine (CYMBALTA) 60 MG capsule Take 60 mg by mouth daily.  . fexofenadine (ALLEGRA) 180 MG tablet Take 180 mg by mouth daily.  Marland Kitchen galantamine (RAZADYNE) 8 MG tablet Take 8 mg by mouth 2 (two) times daily with a meal.   . LORazepam (ATIVAN) 0.5 MG tablet Take 1 tablet (0.5 mg total) by mouth every 4 (four) hours as needed for up to 13 days for anxiety.  . metoprolol tartrate (LOPRESSOR) 25 MG tablet Take 12.5 mg by mouth daily.   . metoprolol tartrate (LOPRESSOR) 25 MG tablet Take 25 mg by mouth daily.  Marland Kitchen omeprazole (PRILOSEC) 20 MG capsule Take 20 mg by mouth daily.  . QUEtiapine (SEROQUEL) 50 MG tablet Take 50 mg by mouth daily.  . traZODone (DESYREL)  50 MG tablet Take 25 mg by mouth at bedtime.   . [DISCONTINUED] metoprolol tartrate (LOPRESSOR) 25 MG tablet Take 25 mg in the morning and 12.5 mg at night. (Patient taking differently: Take 25 mg in the morning)  . [DISCONTINUED] divalproex (DEPAKOTE) 125 MG DR tablet Take 125 mg by mouth 2 (two) times daily.   No facility-administered encounter medications on file as of 04/24/2018.    ROS was provided with assistance of staff Review of Systems  Constitutional: Positive for appetite change and fatigue. Negative for activity change, chills, diaphoresis and fever.  HENT: Positive for hearing loss. Negative for  congestion and voice change.   Eyes: Negative for visual disturbance.  Respiratory: Negative for cough, shortness of breath and wheezing.   Cardiovascular: Negative for chest pain, palpitations and leg swelling.  Gastrointestinal: Negative for abdominal distention, abdominal pain, constipation, diarrhea, nausea and vomiting.  Genitourinary: Negative for difficulty urinating, dysuria and urgency.  Musculoskeletal: Positive for back pain and gait problem.  Skin: Positive for wound.  Neurological: Negative for dizziness, speech difficulty, weakness and headaches.       Dementia  Psychiatric/Behavioral: Positive for agitation, behavioral problems, confusion, decreased concentration and dysphoric mood. Negative for hallucinations and sleep disturbance. The patient is nervous/anxious.     Immunization History  Administered Date(s) Administered  . Influenza Whole 10/22/2017  . Influenza, High Dose Seasonal PF 11/08/2015, 11/07/2016  . Influenza-Unspecified 01/21/2014, 10/29/2015  . Pneumococcal Conjugate-13 04/16/2017  . Pneumococcal Polysaccharide-23 01/21/2014   Pertinent  Health Maintenance Due  Topic Date Due  . INFLUENZA VACCINE  08/22/2018  . PNA vac Low Risk Adult  Completed   Fall Risk  04/18/2017  Falls in the past year? No   Functional Status Survey:    Vitals:   04/24/18 1124  BP: 140/80  Pulse: 90  Resp: 18  Temp: 98.7 F (37.1 C)  SpO2: 96%  Weight: 181 lb 14.4 oz (82.5 kg)  Height: 5\' 9"  (1.753 m)   Body mass index is 26.86 kg/m. Physical Exam Constitutional:      General: He is not in acute distress.    Appearance: Normal appearance. He is not ill-appearing, toxic-appearing or diaphoretic.  HENT:     Head: Normocephalic and atraumatic.     Nose: Nose normal.     Mouth/Throat:     Mouth: Mucous membranes are dry.  Eyes:     Extraocular Movements: Extraocular movements intact.     Conjunctiva/sclera: Conjunctivae normal.     Pupils: Pupils are equal,  round, and reactive to light.  Neck:     Musculoskeletal: Normal range of motion and neck supple.  Cardiovascular:     Rate and Rhythm: Regular rhythm.     Heart sounds: No murmur.  Pulmonary:     Effort: Pulmonary effort is normal.     Breath sounds: No wheezing, rhonchi or rales.  Abdominal:     General: There is no distension.     Palpations: Abdomen is soft.     Tenderness: There is no abdominal tenderness. There is no guarding or rebound.  Musculoskeletal:     Right lower leg: No edema.     Left lower leg: No edema.     Comments: Ambulates with walker.   Skin:    General: Skin is warm and dry.     Findings: Bruising present.     Comments: Skin tear dorsum left hand, left forearm, no s/s of infection  Neurological:     General: No focal deficit  present.     Mental Status: He is alert. Mental status is at baseline.     Comments: Oriented to self.   Psychiatric:     Comments: Irritable, doesn't follow directions.      Labs reviewed: Recent Labs    11/11/17 03/03/18 03/29/18 2042  NA 139 139 138  K 3.9 3.7 3.9  CL 106 103 106  CO2 GLUCOSE  --   --  116*  BUN CREATININE 0.8 1.0 0.96  CALCIUM 8.8 8.8 9.0   Recent Labs    11/11/17 03/03/18 03/29/18 2042  AST ALT ALKPHOS 67 72 70  BILITOT  --   --  0.9  PROT 5.8 6.2 7.2  ALBUMIN 3.6 3.8 4.0   Recent Labs    11/11/17 03/03/18 03/29/18 2042  WBC 7.4 7.1 10.3  NEUTROABS  --   --  6.5  HGB 14.0 14.1 15.6  HCT 41 42 48.1  MCV  --   --  104.3*  PLT 135* 152 143*   Lab Results  Component Value Date   TSH 1.89 06/17/2017   Lab Results  Component Value Date   HGBA1C 5.4 02/20/2017   No results found for: CHOL, HDL, LDLCALC, LDLDIRECT, TRIG, CHOLHDL  Significant Diagnostic Results in last 30 days:  Dg Chest 1 View  Result Date: 03/29/2018 CLINICAL DATA:  Suicidal ideation.  Altered mental status. EXAM: CHEST  1 VIEW COMPARISON:  August 08, 2014 FINDINGS: A moderate  hiatal hernia is identified. The heart, hila, mediastinum, lungs, and pleura are otherwise unremarkable. IMPRESSION: Hiatal hernia.  No other abnormalities. Electronically Signed   By: Gerome Sam III M.D   On: 03/29/2018 20:58    Assessment/Plan Alzheimer's dementia with behavioral disturbance [facility]) Continue memory care unit for safety and care assistance, may dc Galantamine-no longer beneficial in preserving memory is POA consents.   Major depression, recurrent, chronic (HCC) Continue Quetiapine  qd, Depakote  bid, Cymbalta  qd. Dc Ativan-little efficacy. Will have Haldol 2.5mg  q8h prn x 14 days available to him. Observe.   Essential hypertension Blood pressure is controlled, continue Metoprolol 12.5mg  qd,  qd.   GERD (gastroesophageal reflux disease) Stable, continue Omeprazole  qd.   Pain of back and left lower extremity Stable, continue Tylenol  tid, ambulates with walker.   Fall Lack of safety awareness, increased frailty are contributory, continue memory care unit for safety and care assistance, close supervision for safety.   Skin tear of upper extremity Dorsum left hand, left forearm, no s/s of infection, it should heal.      Family/ staff Communication: plan of care reviewed with the patient and charge nurse.   Labs/tests ordered:  none  Time spend 25 minutes.

## 2018-04-27 ENCOUNTER — Encounter: Payer: Self-pay | Admitting: Nurse Practitioner

## 2018-04-28 ENCOUNTER — Non-Acute Institutional Stay (SKILLED_NURSING_FACILITY): Payer: Medicare Other | Admitting: Nurse Practitioner

## 2018-04-28 ENCOUNTER — Encounter: Payer: Self-pay | Admitting: Nurse Practitioner

## 2018-04-28 DIAGNOSIS — F0281 Dementia in other diseases classified elsewhere with behavioral disturbance: Secondary | ICD-10-CM

## 2018-04-28 DIAGNOSIS — W19XXXA Unspecified fall, initial encounter: Secondary | ICD-10-CM | POA: Diagnosis not present

## 2018-04-28 DIAGNOSIS — F339 Major depressive disorder, recurrent, unspecified: Secondary | ICD-10-CM | POA: Diagnosis not present

## 2018-04-28 DIAGNOSIS — G309 Alzheimer's disease, unspecified: Secondary | ICD-10-CM | POA: Diagnosis not present

## 2018-04-28 DIAGNOSIS — S51012A Laceration without foreign body of left elbow, initial encounter: Secondary | ICD-10-CM

## 2018-04-28 DIAGNOSIS — T50905A Adverse effect of unspecified drugs, medicaments and biological substances, initial encounter: Secondary | ICD-10-CM

## 2018-04-28 DIAGNOSIS — G2589 Other specified extrapyramidal and movement disorders: Secondary | ICD-10-CM | POA: Insufficient documentation

## 2018-04-28 DIAGNOSIS — R17 Unspecified jaundice: Secondary | ICD-10-CM

## 2018-04-28 DIAGNOSIS — R269 Unspecified abnormalities of gait and mobility: Secondary | ICD-10-CM

## 2018-04-28 LAB — HEPATIC FUNCTION PANEL
ALT: 26 (ref 10–40)
AST: 29 (ref 14–40)
Alkaline Phosphatase: 68 (ref 25–125)
Bilirubin, Total: 2

## 2018-04-28 LAB — TSH: TSH: 1.71 (ref 0.41–5.90)

## 2018-04-28 LAB — BASIC METABOLIC PANEL
BUN: 20 (ref 4–21)
Creatinine: 0.8 (ref 0.6–1.3)
Glucose: 101
Potassium: 3.8 (ref 3.4–5.3)
Sodium: 136 — AB (ref 137–147)

## 2018-04-28 LAB — VITAMIN B12: Vitamin B-12: 512

## 2018-04-28 LAB — CBC AND DIFFERENTIAL
HCT: 41 (ref 41–53)
Hemoglobin: 14.1 (ref 13.5–17.5)
Platelets: 127 — AB (ref 150–399)
WBC: 10.8

## 2018-04-28 NOTE — Assessment & Plan Note (Signed)
Sustained from the fall, well approximated with steri strips, no s/s of infection, it should heal.

## 2018-04-28 NOTE — Assessment & Plan Note (Signed)
Clinically presumed, will reduce Seroquel to 25mg  qd qd in setting of no longer suicidal, delusional, less depressive mood. observe

## 2018-04-28 NOTE — Assessment & Plan Note (Signed)
Continue Memory care unit [facility] for safety and care assistance, continue Depakote 250mg  bid, prn Haldol for behaviors. Observe.

## 2018-04-28 NOTE — Assessment & Plan Note (Signed)
Mechanical, lack of safety awareness, increased frailty/gait abnormality are contributory, continue close supervision for safety

## 2018-04-28 NOTE — Progress Notes (Addendum)
Location:  [facility] Room Number: 101 Place of Service:  SNF (31) Provider:  Chipper Oman  NP  Sophiagrace Benbrook X, NP  Patient Care Team: Melburn Treiber X, NP as PCP - General (Internal Medicine) Ahilyn Nell X, NP as Nurse Practitioner (Internal Medicine) Mahlon Gammon, MD as Consulting Physician (Internal Medicine)  Extended Emergency Contact Information Primary Emergency Contact: [facility] Address: [address]          [place],  [address] Home Phone: [phone] Relation: None  Code Status:  DNR Goals of care: Advanced Directive information Advanced Directives 04/24/2018  Does Patient Have a Medical Advance Directive? Yes  Type of Estate agent of [place];Out of facility DNR (pink MOST or yellow form)  Does patient want to make changes to medical advance directive? No - Patient declined  Copy of Healthcare Power of Attorney in Chart? Yes - validated most recent copy scanned in chart (See row information)  Pre-existing out of facility DNR order (yellow form or pink MOST form) Yellow form placed in chart (order not valid for inpatient use)     Chief Complaint  Patient presents with   Acute Visit    C/o - generalized weakness, fall, shaky gait    HPI:  Pt is a 85 y.o. male seen today for an acute visit for reported fall and landed on right side 04/27/18 when he lost balance while turned to look at pictures on his cabinet. He sustained skin tear left elbow-steri strips closure is intact. Reported left groin pain and generalized pain are resolved upon my visit with presence of staff nurse and speech therapist. He was noted to have newly developed tremor in fingers, worsened gait, some spasticity in limbs since Seroquel and Depakote. He resides in memory care unit for safety and care assistance. Depression/delusions/suicital ideations are stabilizing on Seroquel 50mg  qd, Depakote 250mg  bid, Trazodone 25mg  qhs. Dementia with behaviors are  managed with prn Haldol.    Past Medical History:  Diagnosis Date   Anxiety    Arthritis    BPH (benign prostatic hyperplasia)    Dementia (HCC)    GERD (gastroesophageal reflux disease)    Hypertension    IBS (irritable bowel syndrome)    Past Surgical History:  Procedure Laterality Date   CHOLECYSTECTOMY     EYE SURGERY      No Known Allergies  Outpatient Encounter Medications as of 04/28/2018  Medication Sig   acetaminophen (TYLENOL) 325 MG tablet Take 650 mg by mouth every 8 (eight) hours as needed for mild pain, moderate pain, fever or headache.    acetaminophen (TYLENOL) 500 MG tablet Take 500 mg by mouth 3 (three) times daily.    divalproex (DEPAKOTE) 250 MG DR tablet Take 250 mg by mouth 2 (two) times daily.   DULoxetine (CYMBALTA) 60 MG capsule Take 60 mg by mouth daily.   fexofenadine (ALLEGRA) 180 MG tablet Take 180 mg by mouth daily.   haloperidol lactate (HALDOL) 5 MG/ML injection Inject 2.5 mg into the muscle every 8 (eight) hours as needed.   metoprolol tartrate (LOPRESSOR) 25 MG tablet Take 12.5 mg by mouth daily.    metoprolol tartrate (LOPRESSOR) 25 MG tablet Take 25 mg by mouth daily.   omeprazole (PRILOSEC) 20 MG capsule Take 20 mg by mouth daily.   QUEtiapine (SEROQUEL) 50 MG tablet Take 50 mg by mouth daily.   traZODone (DESYREL) 50 MG tablet Take 25 mg by mouth at bedtime.    [DISCONTINUED] galantamine (  RAZADYNE) 8 MG tablet Take 8 mg by mouth 2 (two) times daily with a meal.    [DISCONTINUED] LORazepam (ATIVAN) 0.5 MG tablet Take 1 tablet (0.5 mg total) by mouth every 4 (four) hours as needed for up to 13 days for anxiety.   No facility-administered encounter medications on file as of 04/28/2018.    ROS was provided with assistance of staff Review of Systems  Constitutional: Negative for activity change, appetite change, chills, diaphoresis, fatigue and fever.  HENT: Positive for hearing loss. Negative for congestion and voice  change.   Respiratory: Negative for cough, shortness of breath and wheezing.   Cardiovascular: Negative for chest pain, palpitations and leg swelling.  Gastrointestinal: Negative for abdominal distention, abdominal pain, constipation, diarrhea, nausea and vomiting.  Genitourinary: Negative for difficulty urinating, dysuria and urgency.  Musculoskeletal: Positive for gait problem.  Skin: Positive for color change and wound.  Neurological: Positive for tremors and speech difficulty. Negative for dizziness, facial asymmetry, weakness and headaches.       Dementia  Psychiatric/Behavioral: Positive for agitation, behavioral problems and confusion. Negative for hallucinations and sleep disturbance. The patient is not nervous/anxious.     Immunization History  Administered Date(s) Administered   Influenza Whole 10/22/2017   Influenza, High Dose Seasonal PF 11/08/2015, 11/07/2016   Influenza-Unspecified 01/21/2014, 10/29/2015   Pneumococcal Conjugate-13 04/16/2017   Pneumococcal Polysaccharide-23 01/21/2014   Pertinent  Health Maintenance Due  Topic Date Due   INFLUENZA VACCINE  08/22/2018   PNA vac Low Risk Adult  Completed   Fall Risk  04/18/2017  Falls in the past year? No   Functional Status Survey:    Vitals:   04/28/18 1056  BP: 140/90  Pulse: 80  Resp: 18  Temp: 98.7 F (37.1 C)  SpO2: 96%  Weight: 181 lb 14.4 oz (82.5 kg)  Height: 5\' 8"  (1.727 m)   Body mass index is 27.66 kg/m. Physical Exam Vitals signs and nursing note reviewed.  Constitutional:      General: He is not in acute distress.    Appearance: Normal appearance. He is not ill-appearing, toxic-appearing or diaphoretic.  HENT:     Head: Normocephalic.     Nose: Nose normal.     Mouth/Throat:     Mouth: Mucous membranes are moist.  Eyes:     Extraocular Movements: Extraocular movements intact.     Conjunctiva/sclera: Conjunctivae normal.     Pupils: Pupils are equal, round, and reactive to  light.  Neck:     Musculoskeletal: Normal range of motion.  Cardiovascular:     Rate and Rhythm: Normal rate and regular rhythm.     Heart sounds: No murmur.  Pulmonary:     Breath sounds: No wheezing, rhonchi or rales.  Abdominal:     General: There is no distension.     Palpations: Abdomen is soft.     Tenderness: There is no abdominal tenderness. There is no guarding or rebound.  Musculoskeletal:     Right lower leg: No edema.     Left lower leg: No edema.     Comments: W/c for mobility.   Skin:    General: Skin is warm and dry.     Findings: Bruising present.     Comments: Skin tear left elbow, dorsum left hand, right wrist, well approximated with steri strips, no s/s of infection. Bruise R+L hips, left sacrum, dark red in color.   Neurological:     General: No focal deficit present.  Mental Status: He is alert. Mental status is at baseline.     Cranial Nerves: No cranial nerve deficit.     Motor: No weakness.     Coordination: Coordination abnormal.     Gait: Gait abnormal.     Comments: Oriented to self. Finer tremor in fingers, increase spasticity all limbs, worsened gait-w/c for mobility.   Psychiatric:        Mood and Affect: Mood normal.        Behavior: Behavior normal.     Comments: Upon my visit today, smiled.      Labs reviewed: Recent Labs    11/11/17 03/03/18 03/29/18 2042  NA 139 139 138  K 3.9 3.7 3.9  CL 106 103 106  CO2 GLUCOSE  --   --  116*  BUN CREATININE 0.8 1.0 0.96  CALCIUM 8.8 8.8 9.0   Recent Labs    11/11/17 03/03/18 03/29/18 2042  AST ALT ALKPHOS 67 72 70  BILITOT  --   --  0.9  PROT 5.8 6.2 7.2  ALBUMIN 3.6 3.8 4.0   Recent Labs    11/11/17 03/03/18 03/29/18 2042  WBC 7.4 7.1 10.3  NEUTROABS  --   --  6.5  HGB 14.0 14.1 15.6  HCT 41 42 48.1  MCV  --   --  104.3*  PLT 135* 152 143*   Lab Results  Component Value Date   TSH 1.89 06/17/2017   Lab Results  Component Value  Date   HGBA1C 5.4 02/20/2017   No results found for: CHOL, HDL, LDLCALC, LDLDIRECT, TRIG, CHOLHDL  Significant Diagnostic Results in last 30 days:  No results found.  Assessment/Plan Drug-induced extrapyramidal movement disorder Clinically presumed, will reduce Seroquel to  qd qd in setting of no longer suicidal, delusional, less depressive mood. observe  Alzheimer's dementia with behavioral disturbance (HCC) Continue Memory care unit [facility] for safety and care assistance, continue Depakote  bid, prn Haldol for behaviors. Observe.   Major depression, recurrent, chronic (HCC) Stabilizing mood, continue Trazodone  qd, Cymbalta  qd, Seroquel  qd  Fall Mechanical, lack of safety awareness, increased frailty/gait abnormality are contributory, continue close supervision for safety   Gait disorder Worsening, decrease Seroquel, observe.   Skin tear of elbow without complication, left, initial encounter Sustained from the fall, well approximated with steri strips, no s/s of infection, it should heal.   Elevated bilirubin 04/28/18 Na 136, K 3.8, Bun 20, creat 0.83, total bilirubin 2.0, direct bilirubin 0.4, indirect bilirubin 1.6, wbc 10.8, Hgb 14.1, plt 127, Vit B12 517, TSH 1.71.  04/29/18 Dr. Chales Abrahams advised to decreased Depakote  bid, update HFP in 2weeks.      Family/ staff Communication: plan of care reviewed with the patient and charge nurse.   Labs/tests ordered:  none  Time spend 25 minutes.

## 2018-04-28 NOTE — Assessment & Plan Note (Signed)
Worsening, decrease Seroquel, observe.

## 2018-04-28 NOTE — Assessment & Plan Note (Signed)
Stabilizing mood, continue Trazodone 25mg  qd, Cymbalta 60mg  qd, Seroquel 25mg  qd

## 2018-04-29 DIAGNOSIS — R17 Unspecified jaundice: Secondary | ICD-10-CM | POA: Insufficient documentation

## 2018-04-29 NOTE — Assessment & Plan Note (Signed)
04/28/18 Na 136, K 3.8, Bun 20, creat 0.83, total bilirubin 2.0, direct bilirubin 0.4, indirect bilirubin 1.6, wbc 10.8, Hgb 14.1, plt 127, Vit B12 517, TSH 1.71.  04/29/18 Dr. Chales Abrahams advised to decreased Depakote 125mg  bid, update HFP in 2weeks.

## 2018-05-07 ENCOUNTER — Encounter: Payer: Self-pay | Admitting: *Deleted

## 2018-05-07 ENCOUNTER — Non-Acute Institutional Stay (SKILLED_NURSING_FACILITY): Payer: Medicare Other | Admitting: Nurse Practitioner

## 2018-05-07 ENCOUNTER — Other Ambulatory Visit: Payer: Self-pay | Admitting: *Deleted

## 2018-05-07 DIAGNOSIS — M549 Dorsalgia, unspecified: Secondary | ICD-10-CM

## 2018-05-07 DIAGNOSIS — S41112D Laceration without foreign body of left upper arm, subsequent encounter: Secondary | ICD-10-CM

## 2018-05-07 DIAGNOSIS — F339 Major depressive disorder, recurrent, unspecified: Secondary | ICD-10-CM

## 2018-05-07 DIAGNOSIS — I10 Essential (primary) hypertension: Secondary | ICD-10-CM

## 2018-05-07 DIAGNOSIS — G309 Alzheimer's disease, unspecified: Secondary | ICD-10-CM

## 2018-05-07 DIAGNOSIS — K219 Gastro-esophageal reflux disease without esophagitis: Secondary | ICD-10-CM

## 2018-05-07 DIAGNOSIS — S7000XA Contusion of unspecified hip, initial encounter: Secondary | ICD-10-CM | POA: Insufficient documentation

## 2018-05-07 DIAGNOSIS — R17 Unspecified jaundice: Secondary | ICD-10-CM

## 2018-05-07 DIAGNOSIS — F0281 Dementia in other diseases classified elsewhere with behavioral disturbance: Secondary | ICD-10-CM

## 2018-05-07 DIAGNOSIS — M79605 Pain in left leg: Secondary | ICD-10-CM

## 2018-05-07 LAB — BASIC METABOLIC PANEL
Albumin: 3.8
Calcium: 8.9
Carbon Dioxide, Total: 24
Chloride: 101
EGFR (Non-African Amer.): 80
Globulin: 2.6
Total Protein: 6.4 g/dL

## 2018-05-07 NOTE — Assessment & Plan Note (Signed)
memory care unit [facility] for safety and care assistance, his behaviors are better managed, continue Depakote 125mg  bid.

## 2018-05-07 NOTE — Assessment & Plan Note (Signed)
blood pressure is controlled, continue  Metoprolol 12.5mg  qd, 25mg  qd.

## 2018-05-07 NOTE — Assessment & Plan Note (Signed)
Stable, continue Omeprazole 20mg qd.  

## 2018-05-07 NOTE — Assessment & Plan Note (Signed)
Stable, ambulates with walker, continue Tylenol 500mg  tid. Observe.

## 2018-05-07 NOTE — Assessment & Plan Note (Signed)
His mood is stabilizing, conitnue Cymbalta 60mg  qd, Trazodone 25mg  qhs, Seroquel 25mg  qd, prn Haldol.

## 2018-05-07 NOTE — Assessment & Plan Note (Signed)
Dorsum left hand, left forearm, left elbow, left upper arm, scabbed over, healing nicely, no s/s of infection.

## 2018-05-07 NOTE — Assessment & Plan Note (Signed)
04/29/18 decreased Depakote to 125mg  bid due to elevated total bilirubin 2.0, pending HFP in one week.

## 2018-05-07 NOTE — Assessment & Plan Note (Signed)
About the patient's palm sized bruise noted, no s/s of infection, not certain of the etiology, the patient denied pain when palpated, no pain noted with walking. Continue to observe. May obtain X-ray if pain develops.

## 2018-05-07 NOTE — Progress Notes (Signed)
Location:  [facility] Room Number: 101 Place of Service:  SNF (31) Provider:  Chipper Oman  NP  Simcha Speir X, NP  Patient Care Team: Kristle Wesch X, NP as PCP - General (Internal Medicine) Lijah Bourque X, NP as Nurse Practitioner (Internal Medicine) Mahlon Gammon, MD as Consulting Physician (Internal Medicine)  Extended Emergency Contact Information Primary Emergency Contact: [facility] Address: [address]          [place],  [address] Home Phone: [phone] Relation: None  Code Status:  DNR Goals of care: Advanced Directive information Advanced Directives 04/24/2018  Does Patient Have a Medical Advance Directive? Yes  Type of Estate agent of [place];Out of facility DNR (pink MOST or yellow form)  Does patient want to make changes to medical advance directive? No - Patient declined  Copy of Healthcare Power of Attorney in Chart? Yes - validated most recent copy scanned in chart (See row information)  Pre-existing out of facility DNR order (yellow form or pink MOST form) Yellow form placed in chart (order not valid for inpatient use)     Chief Complaint  Patient presents with  . Medical Management of Chronic Issues    HPI:  Pt is a 85 y.o. male seen today for medical management of chronic diseases.     The patient resides in memory care unit [facility] for safety and care assistance, his behaviors are better managed on Depakote 125mg  bid. His mood is stabilizing on Cymbalta 60mg  qd, Trazodone 25mg  qhs, Seroquel 25mg  qd, prn Haldol. GERD stable on Omeprazole 20mg  qd. HTN, blood pressure is controlled on Metoprolol 12.5mg  qd, 25mg  qd. Lower back pain is managed on Tylenol 500mg  tid.    Past Medical History:  Diagnosis Date  . Anxiety   . Arthritis   . BPH (benign prostatic hyperplasia)   . Dementia (HCC)   . GERD (gastroesophageal reflux disease)   . Hypertension   . IBS (irritable bowel syndrome)    Past Surgical History:   Procedure Laterality Date  . CHOLECYSTECTOMY    . EYE SURGERY      No Known Allergies  Outpatient Encounter Medications as of 05/07/2018  Medication Sig  . acetaminophen (TYLENOL) 325 MG tablet Take 650 mg by mouth every 8 (eight) hours as needed for mild pain, moderate pain, fever or headache.   Marland Kitchen acetaminophen (TYLENOL) 500 MG tablet Take 500 mg by mouth 3 (three) times daily.   . divalproex (DEPAKOTE) 250 MG DR tablet Take 1/2 tablet twice daily  . DULoxetine (CYMBALTA) 60 MG capsule Take 60 mg by mouth daily.  . fexofenadine (ALLEGRA) 180 MG tablet Take 180 mg by mouth daily.  . haloperidol lactate (HALDOL) 5 MG/ML injection Inject 2.5 mg into the muscle every 8 (eight) hours as needed.  . metoprolol tartrate (LOPRESSOR) 25 MG tablet Take 12.5 mg by mouth daily.   . metoprolol tartrate (LOPRESSOR) 25 MG tablet Take 25 mg by mouth daily.  Marland Kitchen omeprazole (PRILOSEC) 20 MG capsule Take 20 mg by mouth daily.  . QUEtiapine (SEROQUEL) 50 MG tablet Take 25 mg by mouth at bedtime.   . traZODone (DESYREL) 50 MG tablet Take 25 mg by mouth at bedtime.    No facility-administered encounter medications on file as of 05/07/2018.    ROS was provided with assistance of staff.  Review of Systems  Constitutional: Negative for activity change, appetite change, chills, diaphoresis, fatigue, fever and unexpected weight change.       #[id number]  x3 in the past 3 weeks.   HENT: Positive for hearing loss. Negative for congestion and voice change.   Eyes: Negative for visual disturbance.  Respiratory: Negative for cough, shortness of breath and wheezing.   Cardiovascular: Negative for chest pain, palpitations and leg swelling.  Gastrointestinal: Negative for abdominal distention, abdominal pain, constipation, diarrhea, nausea and vomiting.  Genitourinary: Negative for difficulty urinating, dysuria and urgency.  Musculoskeletal: Positive for back pain and gait problem.  Skin: Positive for wound.       Left  hand, forearm, elbow, upper arm skin tears, scabbed over, no s/s of infection.   Neurological: Positive for speech difficulty. Negative for dizziness, tremors, weakness and headaches.       Dementia, expressive aphasia.   Psychiatric/Behavioral: Positive for agitation, behavioral problems and confusion. Negative for hallucinations and sleep disturbance. The patient is nervous/anxious.     Immunization History  Administered Date(s) Administered  . Influenza Whole 10/22/2017  . Influenza, High Dose Seasonal PF 11/08/2015, 11/07/2016  . Influenza-Unspecified 01/21/2014, 10/29/2015  . Pneumococcal Conjugate-13 04/16/2017  . Pneumococcal Polysaccharide-23 01/21/2014   Pertinent  Health Maintenance Due  Topic Date Due  . INFLUENZA VACCINE  08/22/2018  . PNA vac Low Risk Adult  Completed   Fall Risk  04/18/2017  Falls in the past year? No   Functional Status Survey:    Vitals:   05/07/18 0824  BP: 132/82  Pulse: 86  Resp: 18  Temp: (!) 97.4 F (36.3 C)  SpO2: 96%  Weight: 181 lb 14.4 oz (82.5 kg)  Height:  (1.727 m)   Body mass index is 27.66 kg/m. Physical Exam Constitutional:      General: He is not in acute distress.    Appearance: Normal appearance. He is not ill-appearing or diaphoretic.     Comments: Over weight.   HENT:     Head: Normocephalic and atraumatic.     Nose: Nose normal.     Mouth/Throat:     Mouth: Mucous membranes are moist.  Eyes:     Extraocular Movements: Extraocular movements intact.     Pupils: Pupils are equal, round, and reactive to light.  Neck:     Musculoskeletal: Normal range of motion and neck supple.  Cardiovascular:     Rate and Rhythm: Normal rate and regular rhythm.     Heart sounds: No murmur.  Pulmonary:     Effort: Pulmonary effort is normal.     Breath sounds: No wheezing, rhonchi or rales.  Abdominal:     General: There is no distension.     Palpations: Abdomen is soft.     Tenderness: There is no abdominal  tenderness. There is no guarding or rebound.  Musculoskeletal:     Right lower leg: No edema.     Left lower leg: No edema.     Comments: Ambulates with walker.   Skin:    General: Skin is warm and dry.     Findings: Bruising present.     Comments: Left hand, forearm, elbow, upper arm skin tears, scabbed over, no s/s of infection.  R+L arms healing bruise Left hip bruise about his palm size, able to walk with no c/o pain.   Neurological:     General: No focal deficit present.     Mental Status: He is alert. Mental status is at baseline.     Cranial Nerves: No cranial nerve deficit.     Motor: No weakness.     Coordination: Coordination normal.  Gait: Gait abnormal.     Comments: Oriented to self.   Psychiatric:        Attention and Perception: He does not perceive auditory or visual hallucinations.        Mood and Affect: Mood is not anxious or depressed. Affect is not flat, angry, tearful or inappropriate.        Speech: Speech is not rapid and pressured, slurred or tangential.        Behavior: Behavior is slowed. Behavior is not agitated, aggressive, hyperactive or combative.        Thought Content: Thought content is not paranoid or delusional.        Cognition and Memory: Cognition is impaired. Memory is impaired. He exhibits impaired recent memory and impaired remote memory.        Judgment: Judgment is impulsive.     Comments: Smiling, follows simple directions upon my visit.      Labs reviewed: Recent Labs    03/03/18 03/29/18 2042 04/28/18  NA 139 138 136*  K 3.7 3.9 3.8  CL 103 106 101  CO2 28 23 24   GLUCOSE  --  116*  --   BUN 18 21 20   CREATININE 1.0 0.96 0.8  CALCIUM 8.8 9.0 8.9   Recent Labs    03/03/18 03/29/18 2042 04/28/18  AST 15 19 29   ALT 13 18 26   ALKPHOS 72 70 68  BILITOT  --  0.9  --   PROT 6.2 7.2 6.4  ALBUMIN 3.8 4.0 3.8   Recent Labs    03/03/18 03/29/18 2042 04/28/18  WBC 7.1 10.3 10.8  NEUTROABS  --  6.5  --   HGB 14.1 15.6 14.1   HCT 42 48.1 41  MCV  --  104.3*  --   PLT 152 143* 127*   Lab Results  Component Value Date   TSH 1.71 04/28/2018   Lab Results  Component Value Date   HGBA1C 5.4 02/20/2017   No results found for: CHOL, HDL, LDLCALC, LDLDIRECT, TRIG, CHOLHDL  Significant Diagnostic Results in last 30 days:  No results found.  Assessment/Plan Essential hypertension blood pressure is controlled, continue  Metoprolol 12.5mg  qd, 25mg  qd.   GERD (gastroesophageal reflux disease) Stable, continue Omeprazole 20mg  qd.   Alzheimer's dementia with behavioral disturbance (HCC) [facility] care [facility], his behaviors are better managed, continue Depakote 125mg  bid.  Skin tear of left upper arm without complication Dorsum left hand, left forearm, left elbow, left upper arm, scabbed over, healing nicely, no s/s of infection.   Elevated bilirubin 04/29/18 decreased Depakote to 125mg  bid due to elevated total bilirubin 2.0, pending HFP in one week.   Major depression, recurrent, chronic (HCC) His mood is stabilizing, conitnue Cymbalta 60mg  qd, Trazodone 25mg  qhs, Seroquel 25mg  qd, prn Haldol.  Pain of back and left lower extremity Stable, ambulates with walker, continue Tylenol 500mg  tid. Observe.   Traumatic ecchymosis of hip, initial encounter About the patient's palm sized bruise noted, no s/s of infection, not certain of the etiology, the patient denied pain when palpated, no pain noted with walking. Continue to observe. May obtain X-ray if pain develops.      Family/ staff Communication: plan of care reviewed with the patient and charge nurse.   Labs/tests ordered:  none  Time spend 25 minutes.

## 2018-05-12 ENCOUNTER — Encounter: Payer: Self-pay | Admitting: Internal Medicine

## 2018-05-12 ENCOUNTER — Non-Acute Institutional Stay (SKILLED_NURSING_FACILITY): Payer: Medicare Other | Admitting: Internal Medicine

## 2018-05-12 DIAGNOSIS — R269 Unspecified abnormalities of gait and mobility: Secondary | ICD-10-CM

## 2018-05-12 DIAGNOSIS — W19XXXA Unspecified fall, initial encounter: Secondary | ICD-10-CM | POA: Diagnosis not present

## 2018-05-12 DIAGNOSIS — F0281 Dementia in other diseases classified elsewhere with behavioral disturbance: Secondary | ICD-10-CM

## 2018-05-12 DIAGNOSIS — G309 Alzheimer's disease, unspecified: Secondary | ICD-10-CM

## 2018-05-12 DIAGNOSIS — F339 Major depressive disorder, recurrent, unspecified: Secondary | ICD-10-CM | POA: Diagnosis not present

## 2018-05-12 NOTE — Progress Notes (Signed)
Location:  [facility] Room Number: 101 Place of Service:  SNF (31) Provider:  Einar Crow  MD  Mast, Man X, NP  Patient Care Team: Mast, Man X, NP as PCP - General (Internal Medicine) Mast, Man X, NP as Nurse Practitioner (Internal Medicine) Mahlon Gammon, MD as Consulting Physician (Internal Medicine)  Extended Emergency Contact Information Primary Emergency Contact: [facility] Address: [address]          [place],  [address] Home Phone: [phone] Relation: None  Code Status:  DNR Goals of care: Advanced Directive information Advanced Directives 04/24/2018  Does Patient Have a Medical Advance Directive? Yes  Type of Estate agent of [place];Out of facility DNR (pink MOST or yellow form)  Does patient want to make changes to medical advance directive? No - Patient declined  Copy of Healthcare Power of Attorney in Chart? Yes - validated most recent copy scanned in chart (See row information)  Pre-existing out of facility DNR order (yellow form or pink MOST form) Yellow form placed in chart (order not valid for inpatient use)     Chief Complaint  Patient presents with   Acute Visit    C/o - behavior issues    HPI:  Pt is a 85 y.o. male seen today for an acute visit for follow up on his behavior issues. Patient has h/o Alzheimer's dementia, hypertension, GERD and Depression Patient continues to have  Behavior issues including aggressive behavior towards staff.  Resisting care.  Trying to get up wheelchair.  Has had number of falls.  As per my last note patient continues to say that he does not want to live.  Continues to have hallucinations and psychosis .He was started on Depakote but his dose was decreased  due to Mild increase in his Billirubin This morning patient had another fall from his wheelchair he continues to refuse to eat.  The nurse says his appetite has been poor.  Today patient would not talk to me.   Kept his eyes closed with mumbled few words does have a history of expressive aphasia He has lost almost 10 lbs in past 3 months. Past Medical History:  Diagnosis Date   Anxiety    Arthritis    BPH (benign prostatic hyperplasia)    Dementia (HCC)    GERD (gastroesophageal reflux disease)    Hypertension    IBS (irritable bowel syndrome)    Past Surgical History:  Procedure Laterality Date   CHOLECYSTECTOMY     EYE SURGERY      No Known Allergies  Outpatient Encounter Medications as of 05/12/2018  Medication Sig   acetaminophen (TYLENOL) 325 MG tablet Take 650 mg by mouth every 8 (eight) hours as needed for mild pain, moderate pain, fever or headache.    acetaminophen (TYLENOL) 500 MG tablet Take 500 mg by mouth 3 (three) times daily.    divalproex (DEPAKOTE) 250 MG DR tablet Take 1/2 tablet twice daily   DULoxetine (CYMBALTA) 60 MG capsule Take 60 mg by mouth daily.   fexofenadine (ALLEGRA) 180 MG tablet Take 180 mg by mouth daily.   metoprolol tartrate (LOPRESSOR) 25 MG tablet Take 12.5 mg by mouth daily.    metoprolol tartrate (LOPRESSOR) 25 MG tablet Take 25 mg by mouth daily.   omeprazole (PRILOSEC) 20 MG capsule Take 20 mg by mouth daily.   QUEtiapine (SEROQUEL) 50 MG tablet Take 25 mg by mouth at bedtime.    traZODone (DESYREL) 50 MG tablet Take 25  mg by mouth at bedtime.    [DISCONTINUED] haloperidol lactate (HALDOL) 5 MG/ML injection Inject 2.5 mg into the muscle every 8 (eight) hours as needed.   No facility-administered encounter medications on file as of 05/12/2018.     Review of Systems  Unable to perform ROS: Other (Patient would not answer)    Immunization History  Administered Date(s) Administered   Influenza Whole 10/22/2017   Influenza, High Dose Seasonal PF 11/08/2015, 11/07/2016   Influenza-Unspecified 01/21/2014, 10/29/2015   Pneumococcal Conjugate-13 04/16/2017   Pneumococcal Polysaccharide-23 01/21/2014   Pertinent  Health  Maintenance Due  Topic Date Due   INFLUENZA VACCINE  08/22/2018   PNA vac Low Risk Adult  Completed   Fall Risk  04/18/2017  Falls in the past year? No   Functional Status Survey:    Vitals:   05/12/18 1116  BP: (!) 150/80  Pulse: 88  Resp: 20  Temp: 98.4 F (36.9 C)  SpO2: 96%  Weight: 181 lb 14.4 oz (82.5 kg)  Height:  (1.727 m)   Body mass index is 27.66 kg/m. Physical Exam Vitals signs reviewed.  Constitutional:      Appearance: Normal appearance.  HENT:     Head: Normocephalic.     Nose: Nose normal.     Mouth/Throat:     Mouth: Mucous membranes are moist.     Pharynx: Oropharynx is clear.  Eyes:     Pupils: Pupils are equal, round, and reactive to light.  Neck:     Musculoskeletal: Neck supple.  Cardiovascular:     Rate and Rhythm: Normal rate. Rhythm irregular.     Pulses: Normal pulses.     Heart sounds: Normal heart sounds. No murmur.  Pulmonary:     Effort: Pulmonary effort is normal. No respiratory distress.     Breath sounds: Normal breath sounds. No wheezing.  Abdominal:     General: Abdomen is flat. Bowel sounds are normal. There is no distension.     Palpations: Abdomen is soft.     Tenderness: There is no abdominal tenderness.  Musculoskeletal:     Comments: Mild swelling Bilateral  Skin:    General: Skin is warm and dry.  Neurological:     General: No focal deficit present.     Mental Status: He is alert.     Comments: Kept his Eyes closed and would not talk to me or follow any commands  Psychiatric:        Attention and Perception: He is inattentive.        Mood and Affect: Mood is depressed.        Behavior: Behavior is slowed.        Cognition and Memory: Cognition is impaired.     Labs reviewed: Recent Labs    03/03/18 03/29/18 2042 04/28/18  NA 139 138 136*  K 3.7 3.9 3.8  CL 103 106 101  CO2 GLUCOSE  --  116*  --   BUN CREATININE 1.0 0.96 0.8  CALCIUM 8.8 9.0 8.9   Recent Labs    03/03/18  03/29/18 2042 04/28/18  AST ALT ALKPHOS 72 70 68  BILITOT  --  0.9  --   PROT 6.2 7.2 6.4  ALBUMIN 3.8 4.0 3.8   Recent Labs    03/03/18 03/29/18 2042 04/28/18  WBC 7.1 10.3 10.8  NEUTROABS  --  6.5  --   HGB 14.1  15.6 14.1  HCT 42 48.1 41  MCV  --  104.3*  --   PLT 152 143* 127*   Lab Results  Component Value Date   TSH 1.71 04/28/2018   Lab Results  Component Value Date   HGBA1C 5.4 02/20/2017   No results found for: CHOL, HDL, LDLCALC, LDLDIRECT, TRIG, CHOLHDL  Significant Diagnostic Results in last 30 days:  No results found.  Assessment/Plan Alzheimer's dementia with behavioral disturbance,Expressive aphasia and Psychosis Patient was diagnosed in 2018 in [facility] . His MRI done at that time showed  extensive generalized cortical atrophy with prominent bilateral hippocampal atrophy And is on Galantamine.  He is also on trazodone and Seroquel.  The dose of Seroquel cannot be increased due to his falls. Dose of  Depakote was changed to 125 mg Bid from 250 mg due to increase in Billirubin Repeat hepatic panel pending  Will discontinue his Haldol I/M for now. It was used 4 times in past 2 weeks I have talked to staff to use some other methods to calm him Fall  Patient also Larey SeatFell today. No Injuries noticed.  Per staff he slid himself from his chair. Major depression with decreased appetite and weight loss We will start him on Remeron 7.5 mg QHS Continue Cymbalta 60 mg Due to restrictions we cannot have outside psych services to come and see him but they have placed a special request for him. Hypertension Continue on Lopressor GERD On prilosec Family/ staff Communication:   Labs/tests ordered:   Total time spent in this patient care encounter was  25_  minutes; greater than 50% of the visit spent counseling patient and staff, reviewing records , Labs and coordinating care for problems addressed at this encounter.

## 2018-05-18 ENCOUNTER — Encounter: Payer: Self-pay | Admitting: Nurse Practitioner

## 2018-05-18 ENCOUNTER — Non-Acute Institutional Stay (SKILLED_NURSING_FACILITY): Payer: Medicare Other | Admitting: Nurse Practitioner

## 2018-05-18 DIAGNOSIS — F339 Major depressive disorder, recurrent, unspecified: Secondary | ICD-10-CM

## 2018-05-18 DIAGNOSIS — M79605 Pain in left leg: Secondary | ICD-10-CM

## 2018-05-18 DIAGNOSIS — I10 Essential (primary) hypertension: Secondary | ICD-10-CM

## 2018-05-18 DIAGNOSIS — W19XXXA Unspecified fall, initial encounter: Secondary | ICD-10-CM

## 2018-05-18 DIAGNOSIS — R509 Fever, unspecified: Secondary | ICD-10-CM | POA: Insufficient documentation

## 2018-05-18 DIAGNOSIS — M549 Dorsalgia, unspecified: Secondary | ICD-10-CM

## 2018-05-18 DIAGNOSIS — R269 Unspecified abnormalities of gait and mobility: Secondary | ICD-10-CM

## 2018-05-18 LAB — BASIC METABOLIC PANEL
BUN: 19 (ref 4–21)
Creatinine: 0.8 (ref 0.6–1.3)
Glucose: 131
Potassium: 3.7 (ref 3.4–5.3)
Sodium: 137 (ref 137–147)

## 2018-05-18 LAB — CBC AND DIFFERENTIAL
HCT: 39 — AB (ref 41–53)
Hemoglobin: 13.4 — AB (ref 13.5–17.5)
Platelets: 290 (ref 150–399)
WBC: 10.5

## 2018-05-18 LAB — HEPATIC FUNCTION PANEL
ALT: 20 (ref 10–40)
AST: 23 (ref 14–40)
Alkaline Phosphatase: 279 — AB (ref 25–125)
Bilirubin, Total: 1.3

## 2018-05-18 NOTE — Assessment & Plan Note (Signed)
Blood pressure is controlled, continue Metoprolol 25mg  qd, 12.5mg  qd.

## 2018-05-18 NOTE — Assessment & Plan Note (Signed)
Stable, continue Tylenol 500mg tid.  

## 2018-05-18 NOTE — Assessment & Plan Note (Signed)
Assist the patient for transfer, w/c for mobility.

## 2018-05-18 NOTE — Assessment & Plan Note (Signed)
Lack of safety awareness related to advanced dementia and worsened gait abnormality are contributory, continue memory care unit for safety and care assistance, w/c for mobility.

## 2018-05-18 NOTE — Assessment & Plan Note (Signed)
newly changed medications: up Mirtazapine 30mg  qd, continued prn Trazodone 25mg  hs, dcd Seroquel and Depakote, started Risperdal 0.25mg  bid, 0.5mg  daily prn, decreased Dulozetine 30mg  qd. Observe.

## 2018-05-18 NOTE — Progress Notes (Signed)
Location:  [facility] Room Number: 101 Place of Service:  SNF (31) Provider:  Chipper Oman  NP  ,  X, NP  Patient Care Team: ,  X, NP as PCP - General (Internal Medicine) ,  X, NP as Nurse Practitioner (Internal Medicine) Mahlon Gammon, MD as Consulting Physician (Internal Medicine)  Extended Emergency Contact Information Primary Emergency Contact: [facility] Address: [address]          [place],  [address] Home Phone: [phone] Relation: None  Code Status:  DNR Goals of care: Advanced Directive information Advanced Directives 04/24/2018  Does Patient Have a Medical Advance Directive? Yes  Type of Estate agent of [place];Out of facility DNR (pink MOST or yellow form)  Does patient want to make changes to medical advance directive? No - Patient declined  Copy of Healthcare Power of Attorney in Chart? Yes - validated most recent copy scanned in chart (See row information)  Pre-existing out of facility DNR order (yellow form or pink MOST form) Yellow form placed in chart (order not valid for inpatient use)     Chief Complaint  Patient presents with  . Acute Visit    C/o - Low grade temp.    HPI:  Pt is a 85 y.o. male seen today for an acute visit for low grade T 05/18/18, resolved after Tylenol, denied cough, sore throat, nasal congestion, chest pain/pressure, or palpitation. Reported the patient was found sitting on the floor at his room door, stated he just got weak and had to sit down, no apparent injury 05/16/18, then l found lying on floor 05/18/18. Depression: newly changed medications: up Mirtazapine 30mg  qd, prn Trazodone 25mg  hs, dcd Seroquel and Depakote, started Risperdal 0.25mg  bid, 0.5mg  daily prn, decreased Dulozetine 30mg  qd. Back pain is managed Tylenol 500mg  tid. HTN, blood pressure is controlled on Metoprolol 25mg  qd, 12.5mg  qd. HPI was provided with assistance of staff.   Past Medical  History:  Diagnosis Date  . Anxiety   . Arthritis   . BPH (benign prostatic hyperplasia)   . Dementia (HCC)   . GERD (gastroesophageal reflux disease)   . Hypertension   . IBS (irritable bowel syndrome)    Past Surgical History:  Procedure Laterality Date  . CHOLECYSTECTOMY    . EYE SURGERY      No Known Allergies  Outpatient Encounter Medications as of 05/18/2018  Medication Sig  . acetaminophen (TYLENOL) 325 MG tablet Take 650 mg by mouth every 8 (eight) hours as needed for mild pain, moderate pain, fever or headache.   Marland Kitchen acetaminophen (TYLENOL) 500 MG tablet Take 500 mg by mouth 3 (three) times daily.   . DULoxetine (CYMBALTA) 30 MG capsule Take 30 mg by mouth daily.  . fexofenadine (ALLEGRA) 180 MG tablet Take 180 mg by mouth daily.  . metoprolol tartrate (LOPRESSOR) 25 MG tablet Take 12.5 mg by mouth daily.   . metoprolol tartrate (LOPRESSOR) 25 MG tablet Take 25 mg by mouth daily.  . mirtazapine (REMERON) 30 MG tablet Take 30 mg by mouth at bedtime.  Marland Kitchen omeprazole (PRILOSEC) 20 MG capsule Take 20 mg by mouth daily.  . risperiDONE (RISPERDAL) 0.25 MG tablet Take 0.25 mg by mouth 2 (two) times daily. Resident must be reassessed for continued use of medication after 14 days.  . risperiDONE (RISPERDAL) 0.5 MG tablet Take 0.5 mg by mouth daily as needed.  . traZODone (DESYREL) 50 MG tablet Take 25 mg by mouth at bedtime as needed.   . [  DISCONTINUED] divalproex (DEPAKOTE) 250 MG DR tablet Take 1/2 tablet twice daily  . [DISCONTINUED] DULoxetine (CYMBALTA) 60 MG capsule Take 60 mg by mouth daily.  . [DISCONTINUED] QUEtiapine (SEROQUEL) 50 MG tablet Take 25 mg by mouth at bedtime.    No facility-administered encounter medications on file as of 05/18/2018.    ROS was provided with assistance of staff.  Review of Systems  Constitutional: Positive for fever. Negative for activity change, appetite change, chills, diaphoresis and fatigue.  HENT: Positive for hearing loss. Negative for  congestion, rhinorrhea, sinus pain and voice change.   Eyes: Negative for visual disturbance.  Respiratory: Negative for cough, shortness of breath and wheezing.   Cardiovascular: Negative for chest pain, palpitations and leg swelling.  Gastrointestinal: Negative for abdominal distention, abdominal pain, constipation, diarrhea, nausea and vomiting.  Genitourinary: Negative for difficulty urinating, dysuria and urgency.  Musculoskeletal: Positive for back pain and gait problem.  Skin:       Bruises in arms.   Neurological: Positive for speech difficulty. Negative for dizziness, weakness and headaches.       Dementia  Psychiatric/Behavioral: Positive for behavioral problems and confusion. Negative for agitation, hallucinations and sleep disturbance. The patient is not nervous/anxious.     Immunization History  Administered Date(s) Administered  . Influenza Whole 10/22/2017  . Influenza, High Dose Seasonal PF 11/08/2015, 11/07/2016  . Influenza-Unspecified 01/21/2014, 10/29/2015  . Pneumococcal Conjugate-13 04/16/2017  . Pneumococcal Polysaccharide-23 01/21/2014   Pertinent  Health Maintenance Due  Topic Date Due  . INFLUENZA VACCINE  08/22/2018  . PNA vac Low Risk Adult  Completed   Fall Risk  04/18/2017  Falls in the past year? No   Functional Status Survey:    Vitals:   05/18/18 1002  BP: 138/78  Pulse: 68  Resp: 20  Temp: 98 F (36.7 C)  SpO2: 94%  Weight: 181 lb 14.4 oz (82.5 kg)  Height: 5\' 8"  (1.727 m)   Body mass index is 27.66 kg/m. Physical Exam Vitals signs and nursing note reviewed.  Constitutional:      General: He is not in acute distress.    Appearance: Normal appearance. He is not ill-appearing, toxic-appearing or diaphoretic.  HENT:     Head: Normocephalic and atraumatic.     Nose: Nose normal.     Mouth/Throat:     Mouth: Mucous membranes are moist.  Eyes:     Extraocular Movements: Extraocular movements intact.     Conjunctiva/sclera:  Conjunctivae normal.     Pupils: Pupils are equal, round, and reactive to light.  Neck:     Musculoskeletal: Normal range of motion and neck supple.  Cardiovascular:     Rate and Rhythm: Normal rate and regular rhythm.     Heart sounds: No murmur.  Pulmonary:     Effort: Pulmonary effort is normal.     Breath sounds: No wheezing, rhonchi or rales.  Abdominal:     Palpations: Abdomen is soft.     Tenderness: There is no abdominal tenderness. There is no right CVA tenderness, left CVA tenderness, guarding or rebound.  Musculoskeletal:     Right lower leg: Edema present.     Left lower leg: Edema present.     Comments: Chronic trace edema BLE. W/c for mobility.   Skin:    General: Skin is warm and dry.     Findings: Bruising present.     Comments: Bruises in arms  Neurological:     General: No focal deficit present.  Mental Status: He is alert. Mental status is at baseline.     Cranial Nerves: No cranial nerve deficit.     Motor: No weakness.     Coordination: Coordination normal.     Gait: Gait abnormal.     Comments: Oriented to self.   Psychiatric:        Mood and Affect: Mood normal.     Labs reviewed: Recent Labs    03/03/18 03/29/18 2042 04/28/18  NA 139 138 136*  K 3.7 3.9 3.8  CL 103 106 101  CO2 GLUCOSE  --  116*  --   BUN CREATININE 1.0 0.96 0.8  CALCIUM 8.8 9.0 8.9   Recent Labs    03/03/18 03/29/18 2042 04/28/18  AST ALT ALKPHOS 72 70 68  BILITOT  --  0.9  --   PROT 6.2 7.2 6.4  ALBUMIN 3.8 4.0 3.8   Recent Labs    03/03/18 03/29/18 2042 04/28/18  WBC 7.1 10.3 10.8  NEUTROABS  --  6.5  --   HGB 14.1 15.6 14.1  HCT 42 48.1 41  MCV  --  104.3*  --   PLT 152 143* 127*   Lab Results  Component Value Date   TSH 1.71 04/28/2018   Lab Results  Component Value Date   HGBA1C 5.4 02/20/2017   No results found for: CHOL, HDL, LDLCALC, LDLDIRECT, TRIG, CHOLHDL  Significant Diagnostic Results in last  30 days:  No results found.  Assessment/Plan Fever Low grade, resolved after Tylenol, will update CBC/diff, CMP. Observe.   Major depression, recurrent, chronic (HCC) newly changed medications: up Mirtazapine  qd, continued prn Trazodone  hs, dcd Seroquel and Depakote, started Risperdal 0.25mg  bid, 0.5mg  daily prn, decreased Dulozetine  qd. Observe.   Pain of back and left lower extremity Stable, continue Tylenol  tid.  Essential hypertension Blood pressure is controlled, continue Metoprolol  qd, 12.5mg  qd.   Fall Lack of safety awareness related to advanced dementia and worsened gait abnormality are contributory, continue memory care unit for safety and care assistance, w/c for mobility.   Gait disorder Assist the patient for transfer, w/c for mobility.      Family/ staff Communication: plan of care reviewed with the patient and charge nurse.   Labs/tests ordered: CMP, CBC/diff.   Time spend 25 minutes.

## 2018-05-18 NOTE — Assessment & Plan Note (Signed)
Low grade, resolved after Tylenol, will update CBC/diff, CMP. Observe.

## 2018-05-28 ENCOUNTER — Other Ambulatory Visit: Payer: Self-pay | Admitting: *Deleted

## 2018-05-28 ENCOUNTER — Non-Acute Institutional Stay (SKILLED_NURSING_FACILITY): Payer: Medicare Other | Admitting: Nurse Practitioner

## 2018-05-28 ENCOUNTER — Encounter: Payer: Self-pay | Admitting: Nurse Practitioner

## 2018-05-28 DIAGNOSIS — G309 Alzheimer's disease, unspecified: Secondary | ICD-10-CM | POA: Diagnosis not present

## 2018-05-28 DIAGNOSIS — F339 Major depressive disorder, recurrent, unspecified: Secondary | ICD-10-CM | POA: Diagnosis not present

## 2018-05-28 DIAGNOSIS — F0280 Dementia in other diseases classified elsewhere without behavioral disturbance: Secondary | ICD-10-CM

## 2018-05-28 DIAGNOSIS — L989 Disorder of the skin and subcutaneous tissue, unspecified: Secondary | ICD-10-CM

## 2018-05-28 DIAGNOSIS — S51812D Laceration without foreign body of left forearm, subsequent encounter: Secondary | ICD-10-CM

## 2018-05-28 DIAGNOSIS — R4701 Aphasia: Secondary | ICD-10-CM

## 2018-05-28 DIAGNOSIS — F0282 Dementia in other diseases classified elsewhere, unspecified severity, with psychotic disturbance: Secondary | ICD-10-CM

## 2018-05-28 DIAGNOSIS — I10 Essential (primary) hypertension: Secondary | ICD-10-CM

## 2018-05-28 DIAGNOSIS — F22 Delusional disorders: Secondary | ICD-10-CM

## 2018-05-28 LAB — COMPLETE METABOLIC PANEL WITH GFR
Albumin: 3.7
Calcium: 8.8
Carbon Dioxide, Total: 28
Chloride: 100
EGFR (Non-African Amer.): 81
Globulin: 3
Total Protein: 6.7 g/dL

## 2018-05-28 NOTE — Assessment & Plan Note (Signed)
Improved, will GFR reduce Risperdal to 0.25mg  qhs, will have prn Risperdal 0.5mg  prn available x 14 days. Observe.

## 2018-05-28 NOTE — Progress Notes (Addendum)
[facility]:  [facility] Room Number: 101 Place of Service:  SNF (31) Provider:  Chipper OmanMast, ManXie  NP  Kinnedy Mongiello X, NP  Patient Care Team: Rox Mcgriff X, NP as PCP - General (Internal Medicine) Karema Tocci X, NP as Nurse Practitioner (Internal Medicine) Mahlon GammonGupta, Anjali L, MD as Consulting Physician (Internal Medicine)  Extended Emergency Contact Information Primary Emergency Contact: [facility] Address: [address]          [place],  [id number] Home Phone: [phone] Relation: None  Code Status:  DNR Goals of care: Advanced Directive information Advanced Directives 05/28/2018  Does Patient Have a Medical Advance Directive? -  Type of Estate agentAdvance Directive Healthcare Power of [place];Out of facility DNR (pink MOST or yellow form)  Does patient want to make changes to medical advance directive? No - Patient declined  Copy of Healthcare Power of Attorney in Chart? Yes - validated most recent copy scanned in chart (See row information)  Pre-existing out of facility DNR order (yellow form or pink MOST form) Yellow form placed in chart (order not valid for inpatient use)     Chief Complaint  Patient presents with  . Acute Visit    re-eval psychosis.    HPI:  Pt is a 85 y.o. male seen today for an acute visit for re-evaluate psychosis, improved, expressive aphasia, smiled, no psychosis present, on Risperdal 0.25mg  bid, 0.5mg  prn. He resides in memory care unit [facility] for safety and care assistance. Depression is managed Duloxetine 30mg  qd, Mirtazapine 15mg  qd, Trazodone 25mg  hs prn. HTN, blood pressure is controlled on Metoprolol 12.5mg  qd, 25mg  qd.    Past Medical History:  Diagnosis Date  . Anxiety   . Arthritis   . BPH (benign prostatic hyperplasia)   . Dementia (HCC)   . GERD (gastroesophageal reflux disease)   . Hypertension   . IBS (irritable bowel syndrome)    Past Surgical History:  Procedure Laterality Date  . CHOLECYSTECTOMY    . EYE SURGERY       No Known Allergies  Outpatient Encounter Medications as of 05/28/2018  Medication Sig  . acetaminophen (TYLENOL) 500 MG tablet Take 500 mg by mouth every 6 (six) hours as needed.  . DULoxetine (CYMBALTA) 30 MG capsule Take 30 mg by mouth daily.  . fexofenadine (ALLEGRA) 180 MG tablet Take 180 mg by mouth daily.  Marland Kitchen. lactose free nutrition (BOOST) LIQD Take 237 mLs by mouth 2 (two) times daily between meals.  . metoprolol tartrate (LOPRESSOR) 25 MG tablet Take 12.5 mg by mouth daily.   . metoprolol tartrate (LOPRESSOR) 25 MG tablet Take 25 mg by mouth daily.  . mirtazapine (REMERON) 15 MG tablet Take 15 mg by mouth at bedtime.  Melene Muller. [START ON 06/18/2018] mirtazapine (REMERON) 30 MG tablet Take 15 mg by mouth at bedtime. Give 1/2 tablet = 15 mg  . omeprazole (PRILOSEC) 20 MG capsule Take 20 mg by mouth daily.  . risperiDONE (RISPERDAL) 0.25 MG tablet Take 0.25 mg by mouth at bedtime. Resident must be reassessed for continued use of medication after 14 days.   . risperiDONE (RISPERDAL) 0.5 MG tablet Take 0.5 mg by mouth daily as needed.  . traZODone (DESYREL) 50 MG tablet Take 25 mg by mouth at bedtime as needed.   . [DISCONTINUED] acetaminophen (TYLENOL) 325 MG tablet Take 650 mg by mouth every 8 (eight) hours as needed for mild pain, moderate pain, fever or headache.   . [DISCONTINUED] acetaminophen (TYLENOL) 500 MG tablet Take 500 mg  by mouth every 6 (six) hours as needed.    No facility-administered encounter medications on file as of 05/28/2018.     Review of Systems  Constitutional: Negative for activity change, appetite change, chills, diaphoresis, fatigue and fever.       Overweight  HENT: Positive for hearing loss. Negative for congestion and voice change.   Eyes: Negative for visual disturbance.  Respiratory: Negative for cough, shortness of breath and wheezing.   Cardiovascular: Positive for leg swelling. Negative for chest pain and palpitations.  Gastrointestinal: Negative for abdominal  distention, abdominal pain, constipation, diarrhea, nausea and vomiting.  Genitourinary: Negative for difficulty urinating, dysuria and urgency.  Musculoskeletal: Positive for back pain and gait problem.  Skin: Negative for color change and pallor.       Skin tear left forearm. Top of head skin lesion.   Neurological: Negative for dizziness, speech difficulty and headaches.       Dementia  Psychiatric/Behavioral: Positive for agitation, behavioral problems, confusion and sleep disturbance. Negative for hallucinations. The patient is nervous/anxious.     Immunization History  Administered Date(s) Administered  . Influenza Whole 10/22/2017  . Influenza, High Dose Seasonal PF 11/08/2015, 11/07/2016  . Influenza-Unspecified 01/21/2014, 10/29/2015  . Pneumococcal Conjugate-13 04/16/2017  . Pneumococcal Polysaccharide-23 01/21/2014   Pertinent  Health Maintenance Due  Topic Date Due  . INFLUENZA VACCINE  08/22/2018  . PNA vac Low Risk Adult  Completed   Fall Risk  04/18/2017  Falls in the past year? No   Functional Status Survey:    Vitals:   05/28/18 0953  BP: 120/70  Pulse: 86  Resp: 18  Temp: 98.7 F (37.1 C)  SpO2: 94%  Weight: 171 lb 12.8 oz (77.9 kg)  Height:  (1.727 m)   Body mass index is 26.12 kg/m. Physical Exam Vitals signs and nursing note reviewed.  Constitutional:      General: He is not in acute distress.    Appearance: Normal appearance. He is not ill-appearing, toxic-appearing or diaphoretic.     Comments: Over weight  HENT:     Head: Normocephalic and atraumatic.     Nose: Nose normal. No congestion or rhinorrhea.     Mouth/Throat:     Mouth: Mucous membranes are moist.  Eyes:     Extraocular Movements: Extraocular movements intact.     Conjunctiva/sclera: Conjunctivae normal.     Pupils: Pupils are equal, round, and reactive to light.  Neck:     Musculoskeletal: Normal range of motion and neck supple.  Cardiovascular:     Rate and Rhythm:  Normal rate and regular rhythm.     Pulses: Normal pulses.     Heart sounds: Normal heart sounds. No murmur.  Pulmonary:     Effort: Pulmonary effort is normal.     Breath sounds: No wheezing, rhonchi or rales.  Abdominal:     General: There is no distension.     Palpations: Abdomen is soft.     Tenderness: There is no abdominal tenderness. There is no right CVA tenderness, left CVA tenderness, guarding or rebound.  Musculoskeletal:     Right lower leg: Edema present.     Left lower leg: Edema present.     Comments: Trace edema BLE  Skin:    General: Skin is warm and dry.     Coloration: Skin is not pale.     Findings: Lesion present. No bruising or erythema.     Comments: Left forearm skin tear, healing, well approximated with  steri strips. To of head skin growth, size of a marble, no bleeding or infection, f/u dermatology as needed.   Neurological:     General: No focal deficit present.     Mental Status: He is alert. Mental status is at baseline.     Cranial Nerves: No cranial nerve deficit.     Motor: No weakness.     Coordination: Coordination normal.     Gait: Gait abnormal.     Comments: Oriented to self.  Psychiatric:        Mood and Affect: Mood normal. Mood is not depressed.        Speech: He is noncommunicative.        Behavior: Behavior is not aggressive or combative.        Thought Content: Thought content is not paranoid or delusional. Thought content does not include homicidal or suicidal plan.        Cognition and Memory: Cognition is impaired. Memory is impaired. He exhibits impaired recent memory and impaired remote memory.        Judgment: Judgment is impulsive.     Comments: Expressive aphasia     Labs reviewed: Recent Labs    03/29/18 2042 04/28/18 05/18/18  NA 138 136* 137  K 3.9 3.8 3.7  CL 106 101 100  CO2 23 24 28   GLUCOSE 116*  --   --   BUN 21 20 19   CREATININE 0.96 0.8 0.8  CALCIUM 9.0 8.9 8.8   Recent Labs    03/29/18 2042 04/28/18  05/18/18  AST 19 29 23   ALT 18 26 20   ALKPHOS 70 68 279*  BILITOT 0.9  --   --   PROT 7.2 6.4 6.7  ALBUMIN 4.0 3.8 3.7   Recent Labs    03/29/18 2042 04/28/18 05/18/18  WBC 10.3 10.8 10.5  NEUTROABS 6.5  --   --   HGB 15.6 14.1 13.4*  HCT 48.1 41 39*  MCV 104.3*  --   --   PLT 143* 127* 290   Lab Results  Component Value Date   TSH 1.71 04/28/2018   Lab Results  Component Value Date   HGBA1C 5.4 02/20/2017   No results found for: CHOL, HDL, LDLCALC, LDLDIRECT, TRIG, CHOLHDL  Significant Diagnostic Results in last 30 days:  No results found.  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 Observe.   Skin tear of left forearm without complication Well approximated with steri strips, no s/s of infection, it should heal.   Expressive aphasia Persisted.   Major depression, recurrent, chronic (HCC) His mood is stabilized, continue Mirtazapine 15mg , Duloxetine 30mg  qd, prn Trazodone hs.   Essential hypertension Blood pressure is controlled, continue Metoprolol.   Lesion of skin of scalp Top of head, a marble size, f/u dermatology as needed.      Family/ staff Communication: plan of care reviewed with the patient and charge nurse.  Labs/tests ordered:  none  Time spend 25 minutes.

## 2018-05-28 NOTE — Assessment & Plan Note (Signed)
Blood pressure is controlled, continue Metoprolol. 

## 2018-05-28 NOTE — Assessment & Plan Note (Signed)
Well approximated with steri strips, no s/s of infection, it should heal.  

## 2018-05-28 NOTE — Assessment & Plan Note (Signed)
His mood is stabilized, continue Mirtazapine 15mg , Duloxetine 30mg  qd, prn Trazodone hs.

## 2018-05-28 NOTE — Assessment & Plan Note (Signed)
Persisted.  

## 2018-05-28 NOTE — Assessment & Plan Note (Signed)
Top of head, a marble size, f/u dermatology as needed.

## 2018-06-02 ENCOUNTER — Non-Acute Institutional Stay (SKILLED_NURSING_FACILITY): Payer: Medicare Other | Admitting: Internal Medicine

## 2018-06-02 ENCOUNTER — Encounter: Payer: Self-pay | Admitting: Internal Medicine

## 2018-06-02 DIAGNOSIS — F0281 Dementia in other diseases classified elsewhere with behavioral disturbance: Secondary | ICD-10-CM

## 2018-06-02 DIAGNOSIS — W19XXXS Unspecified fall, sequela: Secondary | ICD-10-CM

## 2018-06-02 DIAGNOSIS — I10 Essential (primary) hypertension: Secondary | ICD-10-CM

## 2018-06-02 DIAGNOSIS — G309 Alzheimer's disease, unspecified: Secondary | ICD-10-CM | POA: Diagnosis not present

## 2018-06-02 DIAGNOSIS — F339 Major depressive disorder, recurrent, unspecified: Secondary | ICD-10-CM

## 2018-06-02 NOTE — Progress Notes (Signed)
Location:  [facility] Room Number: 101 Place of Service:  SNF (31) Provider:  L.MD   Mast, Man X, NP  Patient Care Team: Mast, Man X, NP as PCP - General (Internal Medicine) Mast, Man X, NP as Nurse Practitioner (Internal Medicine) Mahlon Gammon, MD as Consulting Physician (Internal Medicine)  Extended Emergency Contact Information Primary Emergency Contact: [facility] Address: [address]          [place],  [address] Home Phone: [phone] Relation: None  Code Status: DNR  Goals of care: Advanced Directive information Advanced Directives 06/02/2018  Does Patient Have a Medical Advance Directive? Yes  Type of Estate agent of [place];Out of facility DNR (pink MOST or yellow form);Living will  Does patient want to make changes to medical advance directive? No - Patient declined  Copy of Healthcare Power of Attorney in Chart? Yes - validated most recent copy scanned in chart (See row information)  Pre-existing out of facility DNR order (yellow form or pink MOST form) Yellow form placed in chart (order not valid for inpatient use)     Chief Complaint  Patient presents with  . Acute Visit    falls    HPI:  Pt is a 85 y.o. male seen today for an acute visit for Recurrent Falls  Patient has h/oAlzheimer's dementia with Behavior Issues, hypertension, GERD and Depression Patient is in memory unit now. But he continues to have falls. He has been evaluated by Therapy. Nurses help him but he usually has falls when he is by himself and mostly in the evening. He does not listen to the staff and tries to get up from the wheelchair with no assist and falls. His Appetite is poor . Though he is taking Supplements. He also has Growth on his head and waiting for Dermatology to evaluate him' Patient was difficult to arouse when I saw him today. Per nurses he has been up all night and then he sleeps during daytime Unable to  get much history from him Most of the history from Nurses notes     Past Medical History:  Diagnosis Date  . Anxiety   . Arthritis   . BPH (benign prostatic hyperplasia)   . Dementia (HCC)   . GERD (gastroesophageal reflux disease)   . Hypertension   . IBS (irritable bowel syndrome)    Past Surgical History:  Procedure Laterality Date  . CHOLECYSTECTOMY    . EYE SURGERY      No Known Allergies  Outpatient Encounter Medications as of 06/02/2018  Medication Sig  . acetaminophen (TYLENOL) 500 MG tablet Take 500 mg by mouth every 6 (six) hours as needed.  . DULoxetine (CYMBALTA) 30 MG capsule Take 30 mg by mouth daily.  . fexofenadine (ALLEGRA) 180 MG tablet Take 180 mg by mouth daily.  Marland Kitchen lactose free nutrition (BOOST) LIQD Take 237 mLs by mouth 2 (two) times daily between meals.  . metoprolol tartrate (LOPRESSOR) 25 MG tablet Take 12.5 mg by mouth daily.   . metoprolol tartrate (LOPRESSOR) 25 MG tablet Take 25 mg by mouth daily.  . mirtazapine (REMERON) 15 MG tablet Take 15 mg by mouth at bedtime.  Marland Kitchen omeprazole (PRILOSEC) 20 MG capsule Take 20 mg by mouth daily.  . risperiDONE (RISPERDAL) 0.25 MG tablet Take 0.25 mg by mouth at bedtime. Resident must be reassessed for continued use of medication after 14 days.   . risperiDONE (RISPERDAL) 0.5 MG tablet Take 0.5 mg by mouth daily as  needed.  . traZODone (DESYREL) 50 MG tablet Take 25 mg by mouth at bedtime as needed.   . [DISCONTINUED] mirtazapine (REMERON) 30 MG tablet Take 15 mg by mouth at bedtime. Give 1/2 tablet = 15 mg   No facility-administered encounter medications on file as of 06/02/2018.     Review of Systems  Reason unable to perform ROS: Would not respond.    Immunization History  Administered Date(s) Administered  . Influenza Whole 10/22/2017  . Influenza, High Dose Seasonal PF 11/08/2015, 11/07/2016  . Influenza-Unspecified 01/21/2014, 10/29/2015  . Pneumococcal Conjugate-13 04/16/2017  . Pneumococcal  Polysaccharide-23 01/21/2014   Pertinent  Health Maintenance Due  Topic Date Due  . INFLUENZA VACCINE  08/22/2018  . PNA vac Low Risk Adult  Completed   Fall Risk  04/18/2017  Falls in the past year? No   Functional Status Survey:    Vitals:   06/02/18 1418  BP: 126/70  Pulse: 80  Resp: 20  Temp: 98.9 F (37.2 C)  SpO2: 95%  Weight: 171 lb 12.8 oz (77.9 kg)  Height: 5\' 8"  (1.727 m)   Body mass index is 26.12 kg/m. Physical Exam HENT:     Head: Normocephalic.     Comments: Has growth on his head with Recent changes noticed    Nose: Nose normal.     Mouth/Throat:     Mouth: Mucous membranes are moist.     Pharynx: Oropharynx is clear.  Eyes:     Pupils: Pupils are equal, round, and reactive to light.  Neck:     Musculoskeletal: Neck supple.  Cardiovascular:     Rate and Rhythm: Normal rate and regular rhythm.     Pulses: Normal pulses.     Heart sounds: Normal heart sounds.  Pulmonary:     Effort: Pulmonary effort is normal. No respiratory distress.     Breath sounds: Normal breath sounds. No wheezing or rales.  Abdominal:     General: Abdomen is flat. Bowel sounds are normal. There is no distension.     Palpations: Abdomen is soft.     Tenderness: There is no abdominal tenderness. There is no guarding.  Musculoskeletal:        General: No swelling.  Skin:    General: Skin is warm and dry.  Neurological:     General: No focal deficit present.     Mental Status: He is easily aroused.  Psychiatric:     Comments: Is sleeping now. But per nurses stays very agitated and Anxious at night     Labs reviewed: Recent Labs    03/29/18 2042 04/28/18 05/18/18  NA 138 136* 137  K 3.9 3.8 3.7  CL 106 101 100  CO2 23 24 28   GLUCOSE 116*  --   --   BUN 21 20 19   CREATININE 0.96 0.8 0.8  CALCIUM 9.0 8.9 8.8   Recent Labs    03/29/18 2042 04/28/18 05/18/18  AST 19 29 23   ALT 18 26 20   ALKPHOS 70 68 279*  BILITOT 0.9  --   --   PROT 7.2 6.4 6.7  ALBUMIN 4.0  3.8 3.7   Recent Labs    03/29/18 2042 04/28/18 05/18/18  WBC 10.3 10.8 10.5  NEUTROABS 6.5  --   --   HGB 15.6 14.1 13.4*  HCT 48.1 41 39*  MCV 104.3*  --   --   PLT 143* 127* 290   Lab Results  Component Value Date   TSH 1.71 04/28/2018  Lab Results  Component Value Date   HGBA1C 5.4 02/20/2017   No results found for: CHOL, HDL, LDLCALC, LDLDIRECT, TRIG, CHOLHDL  Significant Diagnostic Results in last 30 days:  No results found.  Assessment/Plan  Alzheimer's dementia with behavioral disturbance,Expressive aphasiaand Psychosis Patient was diagnosed in 2018 in [facility] . His MRI done at that time showedextensive generalized cortical atrophy with prominent bilateral hippocampal atrophy Cannot use Depakote due to effect on hepatic function On Low dose of Risperdal And Remeron as per Psych suggestions Continues to have Falls due to not unawareness of his safety. Will continue to provide supportive care Therapy is evaluating him  Major depression with decreased appetite and weight loss Was seen by Psych Now on Remeron 15 mg Cymbalta was reduced to 30 mg  Hypertension Continue on Lopressor GERD On prilosec  Family/ staff Communication:   Labs/tests ordered:  Repeat Hepatic function and BMP  Total time spent in this patient care encounter was  25_  minutes; greater than 50% of the visit spent counseling patient and staff, reviewing records , Labs and coordinating care for problems addressed at this encounter.

## 2018-06-08 ENCOUNTER — Non-Acute Institutional Stay (SKILLED_NURSING_FACILITY): Payer: Medicare Other | Admitting: Nurse Practitioner

## 2018-06-08 ENCOUNTER — Encounter: Payer: Self-pay | Admitting: Nurse Practitioner

## 2018-06-08 DIAGNOSIS — G309 Alzheimer's disease, unspecified: Secondary | ICD-10-CM | POA: Diagnosis not present

## 2018-06-08 DIAGNOSIS — F323 Major depressive disorder, single episode, severe with psychotic features: Secondary | ICD-10-CM | POA: Diagnosis not present

## 2018-06-08 DIAGNOSIS — I10 Essential (primary) hypertension: Secondary | ICD-10-CM | POA: Diagnosis not present

## 2018-06-08 DIAGNOSIS — K219 Gastro-esophageal reflux disease without esophagitis: Secondary | ICD-10-CM | POA: Diagnosis not present

## 2018-06-08 DIAGNOSIS — R634 Abnormal weight loss: Secondary | ICD-10-CM

## 2018-06-08 DIAGNOSIS — F0281 Dementia in other diseases classified elsewhere with behavioral disturbance: Secondary | ICD-10-CM

## 2018-06-08 DIAGNOSIS — S41111D Laceration without foreign body of right upper arm, subsequent encounter: Secondary | ICD-10-CM

## 2018-06-08 NOTE — Assessment & Plan Note (Signed)
Mood is stabilizing, continue Cymbalta 30mg  qd, Mirtazapine 15mg  qd, Risperdal 0.25mg  qd, dc prn Risperdal-no longer needed, continue prn Trazodone. Observe.

## 2018-06-08 NOTE — Assessment & Plan Note (Signed)
Continue Memory care unit [facility] for safety and care assistance.  

## 2018-06-08 NOTE — Assessment & Plan Note (Signed)
F/u dietitian, encourage oral intake, observe.

## 2018-06-08 NOTE — Assessment & Plan Note (Signed)
Blood pressure is controlled, continue Metoprolol 25mg qd, 12.5mg qd.  

## 2018-06-08 NOTE — Progress Notes (Addendum)
Location:   S[facility] Room Number: 110/A Place of Service:  SNF (31) Provider: Arna Snipe Mast NP  Mast, Man X, NP  Patient Care Team: Mast, Man X, NP as PCP - General (Internal Medicine) Mast, Man X, NP as Nurse Practitioner (Internal Medicine) Mahlon Gammon, MD as Consulting Physician (Internal Medicine)  Extended Emergency Contact Information Primary Emergency Contact: [facility] Address: [address]          [place],  [address] Home Phone: [phone] Relation: None  Code Status: DNR Goals of care: Advanced Directive information Advanced Directives 06/08/2018  Does Patient Have a Medical Advance Directive? Yes  Type of Estate agent of [place];Living will;Out of facility DNR (pink MOST or yellow form)  Does patient want to make changes to medical advance directive? No - Patient declined  Copy of Healthcare Power of Attorney in Chart? Yes - validated most recent copy scanned in chart (See row information)  Pre-existing out of facility DNR order (yellow form or pink MOST form) Yellow form placed in chart (order not valid for inpatient use)     Chief Complaint  Patient presents with  . Medical Management of Chronic Issues    Routine visit     HPI:  Pt is a 85 y.o. male seen today for evaluation of the patient's chronic medical conditions.   The patient has depression with psychotic features, stable on Mirtazapine  qd, Cymbalta  qd, Trazodone prn  hs,  Risperdal 0.25mg  qhs, prn Risperdal 0.5mg  daily prn used x1 in the past 2 weeks for reported increased anxiety with agressiveness with somewhat effective. Hx of dementia, garbled speech, rapid with wandering disconnected thoughts, impulsive, and restlessness are at his baseline. He resides in Memory care unit for safety and care assistance. GERD, stable on Omeprazole  qd. HTN, blood pressure is controlled on Metoprolol 12.5mg  qd,  qd.    Past Medical History:   Diagnosis Date  . Anxiety   . Arthritis   . BPH (benign prostatic hyperplasia)   . Dementia (HCC)   . GERD (gastroesophageal reflux disease)   . Hypertension   . IBS (irritable bowel syndrome)    Past Surgical History:  Procedure Laterality Date  . CHOLECYSTECTOMY    . EYE SURGERY      No Known Allergies  Allergies as of 06/08/2018   No Known Allergies     Medication List       Accurate as of Jun 08, 2018  3:15 PM. If you have any questions, ask your nurse or doctor.        acetaminophen 500 MG tablet Commonly known as:  TYLENOL Take 500 mg by mouth every 6 (six) hours as needed.   DULoxetine 30 MG capsule Commonly known as:  CYMBALTA Take 30 mg by mouth daily.   fexofenadine 180 MG tablet Commonly known as:  ALLEGRA Take 180 mg by mouth daily.   lactose free nutrition Liqd Take 237 mLs by mouth 2 (two) times daily between meals.   metoprolol tartrate 25 MG tablet Commonly known as:  LOPRESSOR Take 12.5 mg by mouth daily.   metoprolol tartrate 25 MG tablet Commonly known as:  LOPRESSOR Take 25 mg by mouth daily.   mirtazapine 15 MG tablet Commonly known as:  REMERON Take 15 mg by mouth at bedtime.   omeprazole 20 MG capsule Commonly known as:  PRILOSEC Take 20 mg by mouth daily.   risperiDONE 0.5 MG tablet Commonly known as:  RISPERDAL Take 0.5 mg  by mouth daily as needed.   risperiDONE 0.25 MG tablet Commonly known as:  RISPERDAL Take 0.25 mg by mouth at bedtime. Resident must be reassessed for continued use of medication after 14 days.   traZODone 50 MG tablet Commonly known as:  DESYREL Take 25 mg by mouth at bedtime as needed.      ROS was provided with assistance of staff.  Review of Systems  Constitutional: Positive for unexpected weight change. Negative for appetite change, chills, diaphoresis, fatigue and fever.       #3 Ibs weight loss in one week.  HENT: Positive for hearing loss. Negative for voice change.   Eyes: Negative for  visual disturbance.  Respiratory: Negative for cough, shortness of breath and wheezing.   Cardiovascular: Negative for chest pain, palpitations and leg swelling.  Gastrointestinal: Negative for abdominal distention, abdominal pain, constipation, diarrhea, nausea and vomiting.  Genitourinary: Negative for dysuria and urgency.  Musculoskeletal: Positive for gait problem. Negative for arthralgias and back pain.  Skin: Negative for color change and pallor.       Right upper arm skin tear  Neurological: Negative for dizziness, speech difficulty, weakness and headaches.       Dementia  Psychiatric/Behavioral: Positive for agitation, behavioral problems, confusion, decreased concentration and sleep disturbance. Negative for hallucinations, self-injury and suicidal ideas. The patient is nervous/anxious.     Immunization History  Administered Date(s) Administered  . Influenza Whole 10/22/2017  . Influenza, High Dose Seasonal PF 11/08/2015, 11/07/2016  . Influenza-Unspecified 01/21/2014, 10/29/2015  . Pneumococcal Conjugate-13 04/16/2017  . Pneumococcal Polysaccharide-23 01/21/2014  . Td 01/21/2006, 04/24/2017  . Varicella 10/11/2014   Pertinent  Health Maintenance Due  Topic Date Due  . INFLUENZA VACCINE  08/22/2018  . PNA vac Low Risk Adult  Completed   Fall Risk  04/18/2017  Falls in the past year? No   Functional Status Survey:    Vitals:   06/08/18 1235  BP: 108/60  Pulse: 98  Resp: 20  Temp: 99.1 F (37.3 C)  SpO2: 98%  Weight: 168 lb 3.2 oz (76.3 kg)  Height: 5\' 8"  (1.727 m)   Body mass index is 25.57 kg/m. Physical Exam Vitals signs and nursing note reviewed.  Constitutional:      General: He is not in acute distress.    Appearance: Normal appearance. He is normal weight. He is not ill-appearing, toxic-appearing or diaphoretic.  HENT:     Head: Normocephalic and atraumatic.     Nose: Nose normal.     Mouth/Throat:     Mouth: Mucous membranes are moist.  Eyes:      Extraocular Movements: Extraocular movements intact.     Conjunctiva/sclera: Conjunctivae normal.     Pupils: Pupils are equal, round, and reactive to light.  Neck:     Musculoskeletal: Normal range of motion and neck supple.  Cardiovascular:     Rate and Rhythm: Normal rate and regular rhythm.     Heart sounds: No murmur.  Pulmonary:     Effort: Pulmonary effort is normal.     Breath sounds: No wheezing, rhonchi or rales.  Abdominal:     General: There is no distension.     Palpations: Abdomen is soft.     Tenderness: There is no abdominal tenderness. There is no right CVA tenderness, left CVA tenderness, guarding or rebound.  Skin:    General: Skin is warm and dry.     Findings: Lesion present.     Comments: A skin growth on tope  of head. Right upper arm skin tear, no s/s of infection.   Neurological:     General: No focal deficit present.     Mental Status: He is alert. Mental status is at baseline.     Motor: No weakness.     Coordination: Coordination normal.     Gait: Gait abnormal.  Psychiatric:     Comments: Oriented to self. Impulsive, restless, anxious, rapid/disconneted thoughts.      Labs reviewed: Recent Labs    03/29/18 2042 04/28/18 05/18/18  NA 138 136* 137  K 3.9 3.8 3.7  CL 106 101 100  CO2 23 24 28   GLUCOSE 116*  --   --   BUN 21 20 19   CREATININE 0.96 0.8 0.8  CALCIUM 9.0 8.9 8.8   Recent Labs    03/29/18 2042 04/28/18 05/18/18  AST 19 29 23   ALT 18 26 20   ALKPHOS 70 68 279*  BILITOT 0.9  --   --   PROT 7.2 6.4 6.7  ALBUMIN 4.0 3.8 3.7   Recent Labs    03/29/18 2042 04/28/18 05/18/18  WBC 10.3 10.8 10.5  NEUTROABS 6.5  --   --   HGB 15.6 14.1 13.4*  HCT 48.1 41 39*  MCV 104.3*  --   --   PLT 143* 127* 290   Lab Results  Component Value Date   TSH 1.71 04/28/2018   Lab Results  Component Value Date   HGBA1C 5.4 02/20/2017   No results found for: CHOL, HDL, LDLCALC, LDLDIRECT, TRIG, CHOLHDL  Significant Diagnostic Results in  last 30 days:  No results found.  Assessment/Plan: Essential hypertension Blood pressure is controlled, continue Metoprolol 25mg  qd, 12.5mg  qd.   GERD (gastroesophageal reflux disease) Stable, continue Omeprazole 20mg  qd.   Alzheimer's dementia with behavioral disturbance (HCC) [facility] for [facility] and care assistance  Depression, psychotic (HCC) Mood is stabilizing, continue Cymbalta 30mg  qd, Mirtazapine 15mg  qd, Risperdal 0.25mg  qd, dc prn Risperdal-no longer needed, continue prn Trazodone. Observe.   Skin tear of right upper arm without complication Lateral right upper arm above the elbow, approximated with steri strips, no s/s of infection, it should heal.   Weight loss F/u dietitian, encourage oral intake, observe.     Family/ staff Communication: plan of care reviewed with the patient and charge nurse.   Labs/tests ordered: none  Time spend 25 minutes.

## 2018-06-08 NOTE — Assessment & Plan Note (Signed)
Lateral right upper arm above the elbow, approximated with steri strips, no s/s of infection, it should heal.

## 2018-06-08 NOTE — Assessment & Plan Note (Signed)
Stable, continue Omeprazole 20mg qd.  

## 2018-06-17 ENCOUNTER — Encounter: Payer: Self-pay | Admitting: Nurse Practitioner

## 2018-06-17 ENCOUNTER — Non-Acute Institutional Stay (SKILLED_NURSING_FACILITY): Payer: Medicare Other | Admitting: Nurse Practitioner

## 2018-06-17 DIAGNOSIS — R17 Unspecified jaundice: Secondary | ICD-10-CM | POA: Diagnosis not present

## 2018-06-17 DIAGNOSIS — R509 Fever, unspecified: Secondary | ICD-10-CM | POA: Diagnosis not present

## 2018-06-17 DIAGNOSIS — J181 Lobar pneumonia, unspecified organism: Secondary | ICD-10-CM | POA: Diagnosis not present

## 2018-06-17 DIAGNOSIS — I10 Essential (primary) hypertension: Secondary | ICD-10-CM | POA: Diagnosis not present

## 2018-06-17 DIAGNOSIS — J189 Pneumonia, unspecified organism: Secondary | ICD-10-CM

## 2018-06-17 NOTE — Progress Notes (Addendum)
Location:   SNF [place] Room Number: 110 Place of Service:  SNF (31) Provider: Lennie Odor Jayd Forrey NP  Rachyl Wuebker X, NP  Patient Care Team: Lydell Moga X, NP as PCP - General (Internal Medicine) Forever Arechiga X, NP as Nurse Practitioner (Internal Medicine) Virgie Dad, MD as Consulting Physician (Internal Medicine)  Extended Emergency Contact Information Primary Emergency Contact: [facility] Address: [address]          [place],  [place] Home Phone: [phone] Relation: None  Code Status: DNR Goals of care: Advanced Directive information Advanced Directives 06/08/2018  Does Patient Have a Medical Advance Directive? Yes  Type of Paramedic of [place];Living will;Out of facility DNR (pink MOST or yellow form)  Does patient want to make changes to medical advance directive? No - Patient declined  Copy of [place] in Chart? Yes - validated most recent copy scanned in chart (See row information)  Pre-existing out of facility DNR order (yellow form or pink MOST form) Yellow form placed in chart (order not valid for inpatient use)     Chief Complaint  Patient presents with  . Acute Visit    fever 103.8, rigors, restlessness    HPI:  Pt is a 85 y.o. male seen today for an acute visit for sudden onset fever 103, rigor and non productive cough noted, HPI was provided with assistance of staff, Tylenol prn is available to him. Mild elevated Sbp in setting of fever, he denied headache, chest pain, palpitation, dysuria, no note nausea, vomiting, abd pain, hematuria, or constipation/diarrhea. One Metoprolol 12.65m qd 274mqd.     Past Medical History:  Diagnosis Date  . Anxiety   . Arthritis   . BPH (benign prostatic hyperplasia)   . Dementia ([facility]  . GERD (gastroesophageal reflux disease)   . Hypertension   . IBS (irritable bowel syndrome)    Past Surgical History:  Procedure Laterality Date  . CHOLECYSTECTOMY    . EYE  SURGERY      No Known Allergies  Allergies as of 06/17/2018   No Known Allergies     Medication List       Accurate as of Jun 17, 2018 11:59 PM. If you have any questions, ask your nurse or doctor.        acetaminophen 500 MG tablet Commonly known as:  TYLENOL Take 500 mg by mouth every 6 (six) hours as needed.   DULoxetine 30 MG capsule Commonly known as:  CYMBALTA Take 30 mg by mouth daily.   fexofenadine 180 MG tablet Commonly known as:  ALLEGRA Take 180 mg by mouth daily.   lactose free nutrition Liqd Take 237 mLs by mouth 2 (two) times daily between meals.   metoprolol tartrate 25 MG tablet Commonly known as:  LOPRESSOR Take 12.5 mg by mouth daily.   metoprolol tartrate 25 MG tablet Commonly known as:  LOPRESSOR Take 25 mg by mouth daily.   mirtazapine 15 MG tablet Commonly known as:  REMERON Take 15 mg by mouth at bedtime.   omeprazole 20 MG capsule Commonly known as:  PRILOSEC Take 20 mg by mouth daily.   risperiDONE 0.5 MG tablet Commonly known as:  RISPERDAL Take 0.5 mg by mouth daily as needed.   risperiDONE 0.25 MG tablet Commonly known as:  RISPERDAL Take 0.25 mg by mouth at bedtime. Resident must be reassessed for continued use of medication after 14 days.   traZODone 50 MG tablet Commonly known as:  DESYREL Take 25 mg by mouth at bedtime as needed.      ROS was provided with assistance of staff Review of Systems  Constitutional: Positive for activity change, appetite change, chills, fatigue and fever. Negative for diaphoresis.       Rigors   HENT: Positive for hearing loss. Negative for congestion, postnasal drip, rhinorrhea, sinus pressure, sinus pain, sneezing, sore throat and voice change.   Eyes: Negative for visual disturbance.  Respiratory: Positive for cough. Negative for shortness of breath and wheezing.   Cardiovascular: Negative for chest pain, palpitations and leg swelling.  Gastrointestinal: Negative for abdominal  distention, abdominal pain, constipation, diarrhea, nausea and vomiting.  Genitourinary: Negative for difficulty urinating, dysuria, hematuria and urgency.  Musculoskeletal: Positive for gait problem. Negative for joint swelling, myalgias, neck pain and neck stiffness.  Neurological: Positive for speech difficulty. Negative for dizziness, weakness and headaches.       Dementia, expressive aphasia  Psychiatric/Behavioral: Positive for confusion. Negative for agitation, behavioral problems, hallucinations and sleep disturbance. The patient is nervous/anxious.        Restless    Immunization History  Administered Date(s) Administered  . Influenza Whole 10/22/2017  . Influenza, High Dose Seasonal PF 11/08/2015, 11/07/2016  . Influenza-Unspecified 01/21/2014, 10/29/2015  . Pneumococcal Conjugate-13 04/16/2017  . Pneumococcal Polysaccharide-23 01/21/2014  . Td 01/21/2006, 04/24/2017  . Varicella 10/11/2014   Pertinent  Health Maintenance Due  Topic Date Due  . INFLUENZA VACCINE  08/22/2018  . PNA vac Low Risk Adult  Completed   Fall Risk  04/18/2017  Falls in the past year? No   Functional Status Survey:    Vitals:   06/17/18 1341  BP: (!) 150/78  Pulse: 90  Resp: (!) 24  Temp: (!) 103.8 F (39.9 C)  SpO2: 91%   There is no height or weight on file to calculate BMI. Physical Exam Vitals signs and nursing note reviewed.  Constitutional:      General: He is not in acute distress.    Appearance: He is ill-appearing. He is not toxic-appearing or diaphoretic.  HENT:     Head: Normocephalic and atraumatic.     Nose: Nose normal. No congestion or rhinorrhea.     Mouth/Throat:     Mouth: Mucous membranes are moist.  Eyes:     General: No scleral icterus.    Extraocular Movements: Extraocular movements intact.     Conjunctiva/sclera: Conjunctivae normal.     Pupils: Pupils are equal, round, and reactive to light.  Neck:     Musculoskeletal: Normal range of motion and neck  supple. No neck rigidity or muscular tenderness.  Cardiovascular:     Rate and Rhythm: Normal rate and regular rhythm.     Heart sounds: No murmur.  Pulmonary:     Effort: Pulmonary effort is normal.     Breath sounds: No wheezing, rhonchi or rales.  Abdominal:     General: There is no distension.     Palpations: Abdomen is soft.     Tenderness: There is no abdominal tenderness. There is no right CVA tenderness, left CVA tenderness, guarding or rebound.  Musculoskeletal:     Right lower leg: No edema.     Left lower leg: No edema.     Comments: Moves all extremities when the patient was examined in bed.   Skin:    General: Skin is warm and dry.     Findings: Lesion present.     Comments: Skin lesion on top of head  Neurological:     General: No focal deficit present.     Mental Status: He is alert.     Cranial Nerves: No cranial nerve deficit.     Motor: No weakness.     Coordination: Coordination normal.     Gait: Gait abnormal.     Comments: More confusion, unable to follow simple directions.   Psychiatric:        Attention and Perception: He is inattentive. He does not perceive auditory or visual hallucinations.        Mood and Affect: Mood is anxious. Affect is not tearful.        Speech: He is noncommunicative.        Behavior: Behavior is uncooperative.        Thought Content: Thought content is not paranoid or delusional. Thought content does not include suicidal ideation.        Cognition and Memory: Cognition is impaired. Memory is impaired. He exhibits impaired recent memory and impaired remote memory.        Judgment: Judgment is impulsive and inappropriate.     Comments: Restless in bed, not able to follow simple directions.      Labs reviewed: Recent Labs    03/29/18 2042 04/28/18 05/18/18  NA 138 136* 137  K 3.9 3.8 3.7  CL 106 101 100  CO2 '23 24 28  ' GLUCOSE 116*  --   --   BUN '21 20 19  ' CREATININE 0.96 0.8 0.8  CALCIUM 9.0 8.9 8.8   Recent Labs     03/29/18 2042 04/28/18 05/18/18  AST '19 29 23  ' ALT '18 26 20  ' ALKPHOS 70 68 279*  BILITOT 0.9  --   --   PROT 7.2 6.4 6.7  ALBUMIN 4.0 3.8 3.7   Recent Labs    03/29/18 2042 04/28/18 05/18/18  WBC 10.3 10.8 10.5  NEUTROABS 6.5  --   --   HGB 15.6 14.1 13.4*  HCT 48.1 41 39*  MCV 104.3*  --   --   PLT 143* 127* 290   Lab Results  Component Value Date   TSH 1.71 04/28/2018   Lab Results  Component Value Date   HGBA1C 5.4 02/20/2017   No results found for: CHOL, HDL, LDLCALC, LDLDIRECT, TRIG, CHOLHDL  Significant Diagnostic Results in last 30 days:  No results found.  Assessment/Plan: Fever Fever 103, rigor and non productive cough noted, HPI was provided with assistance of staff, Tylenol 678m q6h prn x 1 week, obtain UA C/S, CBC/diff, CMP, CXR ap view. Monitor VS Sat O2 q shift x 72 hours. Will start Rocephin 1gm IM q12 hours x 5 days for clinically presumed infection of unknown origin after urine specimen is collected,  Isolation for now.   Essential hypertension Mild elevated Sbp in setting of fever, he denied headache, chest pain, palpitation, dysuria, no note nausea, vomiting, abd pain, hematuria, or constipation/diarrhea. Continue Metoprolol 12.575mqd, 2553md.   Elevated bilirubin 06/16/18 Na 135, K 4.1, Bun 22, creat 0.92, TP 5.5, albumin 3.3,alk phos 148, bilirubin total 0.6(0.2-1.2), AST 13, AL% 10, Wbc 9.5, Hgb 11.7, plt 249  Left lower lobe pneumonia ([place]/27/20 CXR mild pulmonary infiltrate in the left lung base consistent with penumonia 06/18/18. Switch from Rocephin to Augmentin 875m82m q12hr x 7 days along with Florastor bid x 7 days. O2 '@2lpm'  via [facility] to maintain SatO2>89%.     Family/ staff Communication: plan of care reviewed with the patient and charge nurse.  Labs/tests ordered: CBC/diff, CMP, UA C/S, +COVID 19 nasal swab, CXR ap  Time spend 35 minutes.

## 2018-06-17 NOTE — Assessment & Plan Note (Signed)
06/16/18 Na 135, K 4.1, Bun 22, creat 0.92, TP 5.5, albumin 3.3,alk phos 148, bilirubin total 0.6(0.2-1.2), AST 13, AL% 10, Wbc 9.5, Hgb 11.7, plt 249

## 2018-06-17 NOTE — Assessment & Plan Note (Signed)
Mild elevated Sbp in setting of fever, he denied headache, chest pain, palpitation, dysuria, no note nausea, vomiting, abd pain, hematuria, or constipation/diarrhea. Continue Metoprolol 12.5mg  qd, 25mg  qd.

## 2018-06-17 NOTE — Assessment & Plan Note (Addendum)
Fever 103, rigor and non productive cough noted, HPI was provided with assistance of staff, Tylenol 650mg  q6h prn x 1 week, obtain UA C/S, CBC/diff, CMP, CXR ap view. Monitor VS Sat O2 q shift x 72 hours. Will start Rocephin 1gm IM q12 hours x 5 days for clinically presumed infection of unknown origin after urine specimen is collected,  Isolation for now.

## 2018-06-18 DIAGNOSIS — J189 Pneumonia, unspecified organism: Secondary | ICD-10-CM | POA: Insufficient documentation

## 2018-06-18 NOTE — Assessment & Plan Note (Signed)
06/17/18 CXR mild pulmonary infiltrate in the left lung base consistent with penumonia 06/18/18. Switch from Rocephin to Augmentin 875mg  po q12hr x 7 days along with Florastor bid x 7 days. O2 @2lpm  via [place] to maintain SatO2>89%.

## 2018-07-06 LAB — BASIC METABOLIC PANEL
BUN: 15 (ref 4–21)
Creatinine: 0.9 (ref 0.6–1.3)
Glucose: 126
Potassium: 4.6 (ref 3.4–5.3)
Sodium: 134 — AB (ref 137–147)

## 2018-07-07 LAB — CBC AND DIFFERENTIAL
HCT: 34 — AB (ref 41–53)
Hemoglobin: 11.8 — AB (ref 13.5–17.5)
Platelets: 247 (ref 150–399)
WBC: 9.5

## 2018-07-14 ENCOUNTER — Encounter: Payer: Self-pay | Admitting: Nurse Practitioner

## 2018-07-14 ENCOUNTER — Non-Acute Institutional Stay (SKILLED_NURSING_FACILITY): Payer: Medicare Other | Admitting: Nurse Practitioner

## 2018-07-14 DIAGNOSIS — R627 Adult failure to thrive: Secondary | ICD-10-CM | POA: Diagnosis not present

## 2018-07-14 DIAGNOSIS — F0280 Dementia in other diseases classified elsewhere without behavioral disturbance: Secondary | ICD-10-CM

## 2018-07-14 DIAGNOSIS — S61412A Laceration without foreign body of left hand, initial encounter: Secondary | ICD-10-CM | POA: Diagnosis not present

## 2018-07-14 DIAGNOSIS — I10 Essential (primary) hypertension: Secondary | ICD-10-CM

## 2018-07-14 DIAGNOSIS — F323 Major depressive disorder, single episode, severe with psychotic features: Secondary | ICD-10-CM | POA: Diagnosis not present

## 2018-07-14 DIAGNOSIS — G309 Alzheimer's disease, unspecified: Secondary | ICD-10-CM

## 2018-07-14 DIAGNOSIS — R1314 Dysphagia, pharyngoesophageal phase: Secondary | ICD-10-CM

## 2018-07-14 DIAGNOSIS — F22 Delusional disorders: Secondary | ICD-10-CM

## 2018-07-14 DIAGNOSIS — W19XXXS Unspecified fall, sequela: Secondary | ICD-10-CM

## 2018-07-14 NOTE — Assessment & Plan Note (Signed)
Medication reduction, dc Cymbalta, Melatonin, Risperidone. Close supervision for safety.

## 2018-07-14 NOTE — Progress Notes (Signed)
Location:  [facility] Room Number: 110A Place of Service:  SNF (31) Provider:  Ursula Dermody X Dreshon Proffit, NP  Smt. Loder X, NP  Patient Care Team: Sierra Spargo X, NP as PCP - General (Internal Medicine) Annet Manukyan X, NP as Nurse Practitioner (Internal Medicine) Mahlon GammonGupta, Anjali L, MD as Consulting Physician (Internal Medicine)  Extended Emergency Contact Information Primary Emergency Contact: [facility] Address: [address]          [place],  [id number] Home Phone: [phone] Relation: None  Code Status:  DNR Goals of care: Advanced Directive information Advanced Directives 07/14/2018  Does Patient Have a Medical Advance Directive? Yes  Type of Estate agentAdvance Directive Healthcare Power of Trophy ClubAttorney;Living will;Out of facility DNR (pink MOST or yellow form)  Does patient want to make changes to medical advance directive? No - Patient declined  Copy of Healthcare Power of Attorney in Chart? Yes - validated most recent copy scanned in chart (See row information)  Pre-existing out of facility DNR order (yellow form or pink MOST form) Yellow form placed in chart (order not valid for inpatient use)     Chief Complaint  Patient presents with  . Acute Visit    Fall     HPI:  Pt is a 85 y.o. male seen today for an acute visit for frequent falling, increased frailty, progression of dementia, not eating or sleeping well, last #20 Ibs in the past 2 months. Hx of dementia, resides in memory care unit, [facility]. Depression, managed on Risperidone 0.25mg  qhs, Mirtazapine 15mg  qd, Duloxetine 30mg  qd. HTN, blood pressure is controlled, on Metoprolol 12.5mg  qd, 25mg  qd.    Past Medical History:  Diagnosis Date  . Anxiety   . Arthritis   . BPH (benign prostatic hyperplasia)   . Dementia (HCC)   . GERD (gastroesophageal reflux disease)   . Hypertension   . IBS (irritable bowel syndrome)    Past Surgical History:  Procedure Laterality Date  . CHOLECYSTECTOMY    . EYE SURGERY      No  Known Allergies  Outpatient Encounter Medications as of 07/14/2018  Medication Sig  . DULoxetine (CYMBALTA) 30 MG capsule Take 30 mg by mouth daily.  . fexofenadine (ALLEGRA) 180 MG tablet Take 180 mg by mouth daily.  Marland Kitchen. lactose free nutrition (BOOST) LIQD Take 237 mLs by mouth 2 (two) times daily between meals.  . Melatonin 3 MG TABS Take 3 mg by mouth at bedtime.  . metoprolol tartrate (LOPRESSOR) 25 MG tablet Take 12.5 mg by mouth daily.   . metoprolol tartrate (LOPRESSOR) 25 MG tablet Take 25 mg by mouth daily.  . mirtazapine (REMERON) 15 MG tablet Take 15 mg by mouth at bedtime.  . Nutritional Supplements (BENECALORIE) LIQD Take 1 packet by mouth 2 (two) times a day. 1 CONTAINER ADDED TO RESIDENT'S WATER BOTTLE.  Marland Kitchen. omeprazole (PRILOSEC) 20 MG capsule Take 20 mg by mouth daily.  . risperiDONE (RISPERDAL) 0.25 MG tablet Take 0.25 mg by mouth at bedtime. Resident must be reassessed for continued use of medication after 14 days.   Marland Kitchen. saccharomyces boulardii (FLORASTOR) 250 MG capsule Take 250 mg by mouth 2 (two) times daily.  . [DISCONTINUED] acetaminophen (TYLENOL) 500 MG tablet Take 500 mg by mouth every 6 (six) hours as needed.  . [DISCONTINUED] risperiDONE (RISPERDAL) 0.5 MG tablet Take 0.5 mg by mouth daily as needed.  . [DISCONTINUED] traZODone (DESYREL) 50 MG tablet Take 25 mg by mouth at bedtime as needed.    No facility-administered  encounter medications on file as of 07/14/2018.     Review of Systems  Constitutional: Positive for unexpected weight change. Negative for activity change, appetite change, chills, diaphoresis, fatigue and fever.       Weight loss #20Ibs in the past 2 months.   HENT: Positive for hearing loss and trouble swallowing. Negative for congestion and voice change.        Nectar  Respiratory: Negative for cough, shortness of breath and wheezing.   Cardiovascular: Negative for chest pain, palpitations and leg swelling.  Gastrointestinal: Negative for abdominal  distention and abdominal pain.  Genitourinary: Negative for difficulty urinating, dysuria and urgency.  Musculoskeletal: Positive for gait problem.  Skin: Positive for wound. Negative for color change and pallor.       Dorsum left 3rd finger skin tear.   Neurological: Positive for speech difficulty. Negative for dizziness, tremors, facial asymmetry, numbness and headaches.       Dementia, expressive aphasia.   Psychiatric/Behavioral: Positive for agitation, behavioral problems, confusion and sleep disturbance. Negative for hallucinations and suicidal ideas. The patient is nervous/anxious.     Immunization History  Administered Date(s) Administered  . Influenza Whole 10/22/2017  . Influenza, High Dose Seasonal PF 11/08/2015, 11/07/2016  . Influenza-Unspecified 01/21/2014, 10/29/2015  . Pneumococcal Conjugate-13 04/16/2017  . Pneumococcal Polysaccharide-23 01/21/2014  . Td 01/21/2006, 04/24/2017  . Varicella 10/11/2014   Pertinent  Health Maintenance Due  Topic Date Due  . INFLUENZA VACCINE  08/22/2018  . PNA vac Low Risk Adult  Completed   Fall Risk  04/18/2017  Falls in the past year? No   Functional Status Survey:    Vitals:   07/14/18 1232  BP: 110/68  Pulse: 95  Resp: 20  Temp: 98.5 F (36.9 C)  TempSrc: Oral  SpO2: 95%  Weight: 160 lb 6.4 oz (72.8 kg)  Height: 5\' 8"  (1.727 m)   Body mass index is 24.39 kg/m. Physical Exam Constitutional:      General: He is not in acute distress.    Appearance: Normal appearance. He is not ill-appearing, toxic-appearing or diaphoretic.  HENT:     Head: Normocephalic and atraumatic.     Nose: Nose normal. No congestion or rhinorrhea.     Mouth/Throat:     Mouth: Mucous membranes are moist.  Eyes:     Extraocular Movements: Extraocular movements intact.     Conjunctiva/sclera: Conjunctivae normal.     Pupils: Pupils are equal, round, and reactive to light.  Neck:     Musculoskeletal: Normal range of motion and neck supple.   Cardiovascular:     Rate and Rhythm: Normal rate and regular rhythm.     Heart sounds: No murmur.  Pulmonary:     Effort: Pulmonary effort is normal.     Breath sounds: No wheezing, rhonchi or rales.  Abdominal:     General: Bowel sounds are normal. There is no distension.     Palpations: Abdomen is soft.     Tenderness: There is no abdominal tenderness. There is no right CVA tenderness, left CVA tenderness, guarding or rebound.  Musculoskeletal:     Right lower leg: No edema.     Left lower leg: No edema.     Comments: Ambulates with walker.   Skin:    General: Skin is warm and dry.     Findings: No rash.     Comments: Dorsum left 3rd finger skin tear, approximated with steri strips, no s/s of infection. Top of head skin growth.  Neurological:     General: No focal deficit present.     Mental Status: He is alert. Mental status is at baseline.     Cranial Nerves: No cranial nerve deficit.     Motor: No weakness.     Coordination: Coordination normal.     Gait: Gait abnormal.     Comments: Oriented to self.   Psychiatric:        Attention and Perception: He does not perceive auditory or visual hallucinations.        Mood and Affect: Mood is depressed. Affect is not angry.        Speech: Speech is tangential.        Thought Content: Thought content is not paranoid or delusional.        Cognition and Memory: Cognition is impaired. He exhibits impaired recent memory and impaired remote memory.        Judgment: Judgment is impulsive.     Labs reviewed: Recent Labs    03/29/18 2042 04/28/18 05/18/18 07/06/18  NA 138 136* 137 134*  K 3.9 3.8 3.7 4.6  CL 106 101 100  --   CO2 23 24 28   --   GLUCOSE 116*  --   --   --   BUN 21 20 19 15   CREATININE 0.96 0.8 0.8 0.9  CALCIUM 9.0 8.9 8.8  --    Recent Labs    03/29/18 2042 04/28/18 05/18/18  AST 19 29 23   ALT 18 26 20   ALKPHOS 70 68 279*  BILITOT 0.9  --   --   PROT 7.2 6.4 6.7  ALBUMIN 4.0 3.8 3.7   Recent Labs     03/29/18 2042 04/28/18 05/18/18 07/07/18  WBC 10.3 10.8 10.5 9.5  NEUTROABS 6.5  --   --   --   HGB 15.6 14.1 13.4* 11.8*  HCT 48.1 41 39* 34*  MCV 104.3*  --   --   --   PLT 143* 127* 290 247   Lab Results  Component Value Date   TSH 1.71 04/28/2018   Lab Results  Component Value Date   HGBA1C 5.4 02/20/2017   No results found for: CHOL, HDL, LDLCALC, LDLDIRECT, TRIG, CHOLHDL  Significant Diagnostic Results in last 30 days:   Assessment/Plan Fall Medication reduction, dc Cymbalta, Melatonin, Risperidone. Close supervision for safety.   Depression, psychotic (HCC) Not sleeping and eating well, will increase Mirtazapine 30mg  qd, prn Lorazepam 1mg  q8h prn. Dc Cymbalta, Melatonin, Risperidone.   Essential hypertension Blood pressure is controlled, dc Metoprolol 12.5mg  qd ,continue Metoprolol 25mg  qd.   Failure to thrive in adult Progression of dementia, update CBC/BMP, Hospice referral, continue supportive care.   Dysphagia Nectar thick liquids. Aspiration precaution.   Skin tear of hand without complication Dorsum left 3rd finger, approximated with steri strips, no s/s of infection, it should heal.   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 Continue memory care unit [facility] for safety and care assistance.      Family/ staff Communication: plan of care reviewed with the patient, supervising physician Dr. Chales AbrahamsGupta, and charge nurse.   Labs/tests ordered: BMP, CBC  Time spend 35 minutes.

## 2018-07-14 NOTE — Assessment & Plan Note (Signed)
Progression of dementia, no apparent delusions, dc Risperidone. Continue memory care unit [facility] for safety and care assistance.

## 2018-07-14 NOTE — Assessment & Plan Note (Addendum)
Progression of dementia, update CBC/BMP, Hospice referral, continue supportive care.

## 2018-07-14 NOTE — Assessment & Plan Note (Signed)
Dorsum left 3rd finger, approximated with steri strips, no s/s of infection, it should heal.

## 2018-07-14 NOTE — Assessment & Plan Note (Signed)
Nectar thick liquids. Aspiration precaution.

## 2018-07-14 NOTE — Assessment & Plan Note (Signed)
Blood pressure is controlled, dc Metoprolol 12.5mg  qd ,continue Metoprolol 25mg  qd.

## 2018-07-14 NOTE — Assessment & Plan Note (Signed)
Not sleeping and eating well, will increase Mirtazapine 30mg  qd, prn Lorazepam 1mg  q8h prn. Dc Cymbalta, Melatonin, Risperidone.

## 2018-07-16 ENCOUNTER — Other Ambulatory Visit: Payer: Self-pay | Admitting: Nurse Practitioner

## 2018-07-16 DIAGNOSIS — F339 Major depressive disorder, recurrent, unspecified: Secondary | ICD-10-CM

## 2018-07-16 LAB — CBC AND DIFFERENTIAL
HCT: 34 — AB (ref 41–53)
Hemoglobin: 11.4 — AB (ref 13.5–17.5)
Platelets: 302 (ref 150–399)

## 2018-07-16 LAB — BASIC METABOLIC PANEL
BUN: 12 (ref 4–21)
Creatinine: 0.8 (ref 0.6–1.3)
Glucose: 88
Potassium: 3.5 (ref 3.4–5.3)
Sodium: 139 (ref 137–147)

## 2018-07-16 MED ORDER — LORAZEPAM 1 MG PO TABS: 1.0000 mg | ORAL_TABLET | Freq: Three times a day (TID) | ORAL | 0 refills | Status: AC | PRN

## 2018-07-17 ENCOUNTER — Non-Acute Institutional Stay (SKILLED_NURSING_FACILITY): Payer: Medicare Other | Admitting: Internal Medicine

## 2018-07-17 ENCOUNTER — Encounter: Payer: Self-pay | Admitting: Internal Medicine

## 2018-07-17 DIAGNOSIS — R296 Repeated falls: Secondary | ICD-10-CM | POA: Diagnosis not present

## 2018-07-17 DIAGNOSIS — I10 Essential (primary) hypertension: Secondary | ICD-10-CM | POA: Diagnosis not present

## 2018-07-17 DIAGNOSIS — K219 Gastro-esophageal reflux disease without esophagitis: Secondary | ICD-10-CM

## 2018-07-17 DIAGNOSIS — G309 Alzheimer's disease, unspecified: Secondary | ICD-10-CM | POA: Diagnosis not present

## 2018-07-17 DIAGNOSIS — F0281 Dementia in other diseases classified elsewhere with behavioral disturbance: Secondary | ICD-10-CM

## 2018-07-17 DIAGNOSIS — R634 Abnormal weight loss: Secondary | ICD-10-CM

## 2018-07-17 NOTE — Progress Notes (Signed)
Location:  [facility] Room Number: 110 Place of Service:  SNF (31) Provider:  Dr. Einar CrowAnjali Gupta  Mast, Man X, NP  Patient Care Team: Mast, Man X, NP as PCP - General (Internal Medicine) Mast, Man X, NP as Nurse Practitioner (Internal Medicine) Mahlon GammonGupta, Anjali L, MD as Consulting Physician (Internal Medicine)  Extended Emergency Contact Information Primary Emergency Contact: [facility]PAULINE Address: [address]          [place],  [id number] Home Phone: [phone] Relation: None  Code Status:  DNR Goals of care: Advanced Directive information Advanced Directives 07/17/2018  Does Patient Have a Medical Advance Directive? Yes  Type of Estate agentAdvance Directive Healthcare Power of [place]Attorney;Living will;Out of facility DNR (pink MOST or yellow form)  Does patient want to make changes to medical advance directive? No - Patient declined  Copy of Healthcare Power of Attorney in Chart? Yes - validated most recent copy scanned in chart (See row information)  Pre-existing out of facility DNR order (yellow form or pink MOST form) Yellow form placed in chart (order not valid for inpatient use)     Chief Complaint  Patient presents with  . Acute Visit    Fall    HPI:  Pt is a 85 y.o. male seen today for an acute visit for Recurrent Falls 6 in past month with weight loss, confusion and agitation  Patient has h/oAlzheimer's dementia with Behavior Issues, hypertension, GERD and Depression unFortunately patient has been doing very poorly.  He is under hospice care now.  He continues to have recurrent falls.  He stays very agitated.  He does not respond well to antianxiety or antipsychotic.  Is not eating and lost 20 pounds in past 2 months. Per Nurses patient does not listen to them and tries to get out of the bed and tries to sit in the floor. He has bruises on his extremities Today patient had a sitter with him but he kept on trying to get up.  Per nurses he had not slept  the whole night and they had to give him Ativan to calm him down.  Past Medical History:  Diagnosis Date  . Anxiety   . Arthritis   . BPH (benign prostatic hyperplasia)   . Dementia (HCC)   . GERD (gastroesophageal reflux disease)   . Hypertension   . IBS (irritable bowel syndrome)    Past Surgical History:  Procedure Laterality Date  . CHOLECYSTECTOMY    . EYE SURGERY      No Known Allergies  Outpatient Encounter Medications as of 07/17/2018  Medication Sig  . fexofenadine (ALLEGRA) 180 MG tablet Take 180 mg by mouth daily.  Marland Kitchen. lactose free nutrition (BOOST) LIQD Take 237 mLs by mouth 2 (two) times daily between meals.  Marland Kitchen. LORazepam (ATIVAN) 1 MG tablet Take 1 tablet (1 mg total) by mouth every 8 (eight) hours as needed for up to 14 days for anxiety.  . metoprolol tartrate (LOPRESSOR) 25 MG tablet Take 12.5 mg by mouth daily.   . mirtazapine (REMERON) 30 MG tablet Take 30 mg by mouth at bedtime.  . Nutritional Supplements (BENECALORIE) LIQD Take 1 packet by mouth 2 (two) times a day. 1 CONTAINER ADDED TO RESIDENT'S WATER BOTTLE.  Marland Kitchen. omeprazole (PRILOSEC) 20 MG capsule Take 20 mg by mouth daily.  . [DISCONTINUED] DULoxetine (CYMBALTA) 30 MG capsule Take 30 mg by mouth daily.  . [DISCONTINUED] Melatonin 3 MG TABS Take 3 mg by mouth at bedtime.  . [DISCONTINUED] metoprolol  tartrate (LOPRESSOR) 25 MG tablet Take 25 mg by mouth daily.  . [DISCONTINUED] mirtazapine (REMERON) 15 MG tablet Take 15 mg by mouth at bedtime.  . [DISCONTINUED] risperiDONE (RISPERDAL) 0.25 MG tablet Take 0.25 mg by mouth at bedtime. Resident must be reassessed for continued use of medication after 14 days.    No facility-administered encounter medications on file as of 07/17/2018.     Review of Systems  Unable to perform ROS: Dementia    Immunization History  Administered Date(s) Administered  . Influenza Whole 10/22/2017  . Influenza, High Dose Seasonal PF 11/08/2015, 11/07/2016  . Influenza-Unspecified  01/21/2014, 10/29/2015  . Pneumococcal Conjugate-13 04/16/2017  . Pneumococcal Polysaccharide-23 01/21/2014  . Td 01/21/2006, 04/24/2017  . Varicella 10/11/2014   Pertinent  Health Maintenance Due  Topic Date Due  . INFLUENZA VACCINE  08/22/2018  . PNA vac Low Risk Adult  Completed   Fall Risk  04/18/2017  Falls in the past year? No   Functional Status Survey:    Vitals:   07/17/18 1423  BP: 118/68  Pulse: 76  Resp: 20  Temp: 98.7 F (37.1 C)  TempSrc: Oral  SpO2: 95%  Weight: 152 lb 6.4 oz (69.1 kg)  Height: 5\' 8"  (1.727 m)   Body mass index is 23.17 kg/m. Physical Exam Vitals signs reviewed.  Constitutional:      Comments: Keeps his Eyes Closed and tries to get out of bed  HENT:     Nose: Nose normal.     Mouth/Throat:     Mouth: Mucous membranes are moist.     Pharynx: Oropharynx is clear.  Neck:     Musculoskeletal: Neck supple.  Cardiovascular:     Rate and Rhythm: Normal rate and regular rhythm.  Pulmonary:     Effort: Pulmonary effort is normal.     Breath sounds: Normal breath sounds.  Abdominal:     General: Abdomen is flat. Bowel sounds are normal.     Palpations: Abdomen is soft.  Musculoskeletal:        General: No swelling.  Skin:    General: Skin is warm and dry.  Neurological:     General: No focal deficit present.     Comments: Would not follow any Commands or answer any questions  Psychiatric:     Comments: Stays Restless     Labs reviewed: Recent Labs    03/29/18 2042 04/28/18 05/18/18 07/06/18  NA 138 136* 137 134*  K 3.9 3.8 3.7 4.6  CL 106 101 100  --   CO2 23 24 28   --   GLUCOSE 116*  --   --   --   BUN 21 20 19 15   CREATININE 0.96 0.8 0.8 0.9  CALCIUM 9.0 8.9 8.8  --    Recent Labs    03/29/18 2042 04/28/18 05/18/18  AST 19 29 23   ALT 18 26 20   ALKPHOS 70 68 279*  BILITOT 0.9  --   --   PROT 7.2 6.4 6.7  ALBUMIN 4.0 3.8 3.7   Recent Labs    03/29/18 2042 04/28/18 05/18/18 07/07/18  WBC 10.3 10.8 10.5 9.5   NEUTROABS 6.5  --   --   --   HGB 15.6 14.1 13.4* 11.8*  HCT 48.1 41 39* 34*  MCV 104.3*  --   --   --   PLT 143* 127* 290 247   Lab Results  Component Value Date   TSH 1.71 04/28/2018   Lab Results  Component Value  Date   HGBA1C 5.4 02/20/2017    Significant Diagnostic Results in last 30 days:   Assessment/Plan Alzheimer's dementia with behavioral disturbance,Expressive aphasiaand Psychosis Patient was diagnosed in 2018 in [facility] . His MRI done at that time showedextensive generalized cortical atrophy with prominent bilateral hippocampal atrophy He has been tried on all antipsychotics and nothing seems to have helped Discontinued his Cymbalta Continue on Remeron We will restart his Depakote which was previously stopped due to effect on hepatic function.  But at this time patient is not responding to any Sedatives and antipsychotics. All these medications make him have falls Failed Therapy Has Sitter with him today   Major depression with decreased appetite and weight loss Was seen by Psych On Remeron Continues to lose weight and has lost almost 20 pounds in past 2 months Refuses to eat Under Hospice Care Hypertension Continue on Lopressor GERD On prilosec   Family/ staff Communication:   Labs/tests ordered:    Total time spent in this patient care encounter was  25_  minutes; greater than 50% of the visit spent counseling  staff, reviewing records , Labs and coordinating care for problems addressed at this encounter.

## 2018-07-18 DIAGNOSIS — R296 Repeated falls: Secondary | ICD-10-CM | POA: Insufficient documentation

## 2018-08-12 ENCOUNTER — Non-Acute Institutional Stay (SKILLED_NURSING_FACILITY): Payer: Medicare Other | Admitting: Internal Medicine

## 2018-08-12 ENCOUNTER — Encounter: Payer: Self-pay | Admitting: Internal Medicine

## 2018-08-12 DIAGNOSIS — R627 Adult failure to thrive: Secondary | ICD-10-CM | POA: Diagnosis not present

## 2018-08-12 DIAGNOSIS — F0281 Dementia in other diseases classified elsewhere with behavioral disturbance: Secondary | ICD-10-CM | POA: Diagnosis not present

## 2018-08-12 DIAGNOSIS — L989 Disorder of the skin and subcutaneous tissue, unspecified: Secondary | ICD-10-CM

## 2018-08-12 DIAGNOSIS — G309 Alzheimer's disease, unspecified: Secondary | ICD-10-CM | POA: Diagnosis not present

## 2018-08-12 MED ORDER — MORPHINE SULFATE (CONCENTRATE) 20 MG/ML PO SOLN: 5.0000 mg | ORAL | 0 refills | Status: AC | PRN

## 2018-08-12 NOTE — Progress Notes (Signed)
Location:  [facility] Room Number: 110 Place of Service:  SNF (31) Provider: Einar CrowGupta  l,MD   Mast, Man X, NP  Patient Care Team: Mast, Man X, NP as PCP - General (Internal Medicine) Mast, Man X, NP as Nurse Practitioner (Internal Medicine) Mahlon GammonGupta,  L, MD as Consulting Physician (Internal Medicine)  Extended Emergency Contact Information Primary Emergency Contact: [facility] Address: [address]          [place],  [id number] Home Phone: [phone] Relation: None  Code Status:DNR  Goals of care: Advanced Directive information Advanced Directives 08/12/2018  Does Patient Have a Medical Advance Directive? Yes  Type of Advance Directive Living will;Healthcare Power of [place];Out of facility DNR (pink MOST or yellow form)  Does patient want to make changes to medical advance directive? No - Patient declined  Copy of Healthcare Power of Attorney in Chart? Yes - validated most recent copy scanned in chart (See row information)  Pre-existing out of facility DNR order (yellow form or pink MOST form) Yellow form placed in chart (order not valid for inpatient use)     Chief Complaint  Patient presents with  . Medical Management of Chronic Issues    routine visit, pain management     HPI:  Pt is a 85 y.o. male seen today for medical management of chronic diseases.   Patient has h/oAlzheimer's dementiawith Behavior Issues, hypertension, GERD and Depression But for past few months patient has also struggled with weight loss, failure to thrive, confusion and agitation, recurrent falls.  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  That dermatology had done the biopsy but but not able to get the tissue and the patient's family refused more work-up.  Patient has lost almost 20 pounds in past 2 months.  He is not eating just drinks supplements  Patient was in distress trying to reach things around  on the window.  Was very restless.  This morning he still is eyes closed and mumbling.  He was tachycardic and had a mild cough.  Though did not have fever and his pulse ox was 95% on room air.  Patient unable to give me any more history. He is under hospice Care  Past Medical History:  Diagnosis Date  . Anxiety   . Arthritis   . BPH (benign prostatic hyperplasia)   . Dementia (HCC)   . GERD (gastroesophageal reflux disease)   . Hypertension   . IBS (irritable bowel syndrome)    Past Surgical History:  Procedure Laterality Date  . CHOLECYSTECTOMY    . EYE SURGERY      No Known Allergies  Outpatient Encounter Medications as of 08/12/2018  Medication Sig  . divalproex (DEPAKOTE SPRINKLE) 125 MG capsule Take by mouth 2 (two) times daily.  . fexofenadine (ALLEGRA) 180 MG tablet Take 180 mg by mouth daily.  Marland Kitchen. lactose free nutrition (BOOST) LIQD Take 237 mLs by mouth 2 (two) times daily between meals.  Marland Kitchen. LORazepam (ATIVAN) 1 MG tablet Take 1 mg by mouth every 8 (eight) hours as needed for anxiety.  Marland Kitchen. LORazepam (ATIVAN) 2 MG tablet Take 1 mg by mouth every 6 (six) hours as needed for anxiety.  . metoprolol tartrate (LOPRESSOR) 25 MG tablet Take 12.5 mg by mouth 2 (two) times daily.   . mirtazapine (REMERON) 30 MG tablet Take 30 mg by mouth at bedtime.  . Nutritional Supplements (BENECALORIE) LIQD Take 1 packet by mouth 2 (two) times  a day. 1 CONTAINER ADDED TO RESIDENT'S WATER BOTTLE.  Marland Kitchen omeprazole (PRILOSEC) 20 MG capsule Take 20 mg by mouth daily.   No facility-administered encounter medications on file as of 08/12/2018.     Review of Systems  Unable to perform ROS: Dementia    Immunization History  Administered Date(s) Administered  . Influenza Whole 10/22/2017  . Influenza, High Dose Seasonal PF 11/08/2015, 11/07/2016  . Influenza-Unspecified 01/21/2014, 10/29/2015  . Pneumococcal Conjugate-13 04/16/2017  . Pneumococcal Polysaccharide-23 01/21/2014  . Td 01/21/2006,  04/24/2017  . Varicella 10/11/2014   Pertinent  Health Maintenance Due  Topic Date Due  . INFLUENZA VACCINE  08/22/2018  . PNA vac Low Risk Adult  Completed   Fall Risk  04/18/2017  Falls in the past year? No   Functional Status Survey:    Vitals:   08/12/18 1053  BP: 104/60  Pulse: 76  Resp: (!) 24  Temp: 98.7 F (37.1 C)  SpO2: 91%  Weight: 151 lb 8 oz (68.7 kg)  Height: 5\' 8"  (1.727 m)   Body mass index is 23.04 kg/m. Physical Exam Vitals signs reviewed.  Constitutional:      Appearance: Normal appearance.     Comments: Keeps His Eyes Close and Mumbles. Not following Commands  HENT:     Head: Normocephalic.     Comments: Has huge Growth on his scalp which looks Malignant    Nose: Nose normal.     Mouth/Throat:     Mouth: Mucous membranes are dry.  Eyes:     Pupils: Pupils are equal, round, and reactive to light.  Neck:     Musculoskeletal: Neck supple.  Cardiovascular:     Rate and Rhythm: Tachycardia present.     Pulses: Normal pulses.  Pulmonary:     Effort: Pulmonary effort is normal. No respiratory distress.     Breath sounds: Normal breath sounds. No wheezing or rales.  Abdominal:     General: Abdomen is flat. Bowel sounds are normal.     Palpations: Abdomen is soft.  Musculoskeletal:        General: No swelling.  Skin:    General: Skin is warm and dry.  Neurological:     General: No focal deficit present.  Psychiatric:        Mood and Affect: Mood normal.        Thought Content: Thought content normal.     Labs reviewed: Recent Labs    03/29/18 2042 04/28/18 05/18/18 07/06/18 07/16/18  NA 138 136* 137 134* 139  K 3.9 3.8 3.7 4.6 3.5  CL 106 101 100  --   --   CO2 23 24 28   --   --   GLUCOSE 116*  --   --   --   --   BUN 21 20 19 15 12   CREATININE 0.96 0.8 0.8 0.9 0.8  CALCIUM 9.0 8.9 8.8  --   --    Recent Labs    03/29/18 2042 04/28/18 05/18/18  AST 19 29 23   ALT 18 26 20   ALKPHOS 70 68 279*  BILITOT 0.9  --   --   PROT 7.2  6.4 6.7  ALBUMIN 4.0 3.8 3.7   Recent Labs    03/29/18 2042 04/28/18 05/18/18 07/07/18 07/16/18  WBC 10.3 10.8 10.5 9.5  --   NEUTROABS 6.5  --   --   --   --   HGB 15.6 14.1 13.4* 11.8* 11.4*  HCT 48.1 41 39* 34* 34*  MCV 104.3*  --   --   --   --   PLT 143* 127* 290 247 302   Lab Results  Component Value Date   TSH 1.71 04/28/2018   Lab Results  Component Value Date   HGBA1C 5.4 02/20/2017   No results found for: CHOL, HDL, LDLCALC, LDLDIRECT, TRIG, CHOLHDL  Significant Diagnostic Results in last 30 days:  No results found.  Assessment/Plan Malignant tumor of scalp Do not have the Pathology as  his family has refused further Work up With his worsening clinical status will discuss with family and start him on Roxanol for pain management. Patient is under hospice care Will also Discontinue other Meds like Metoprolol, Allegra Remeron and Decrase the dose of Depakote D/W Hospice and His Family   Alzheimer's dementia with behavior disturbances, expressive aphasia and now failure to thrive. He is on a low-dose of Depakote Patient has not responded to any sedatives and antipsychotics before. Nurses are keeping a close monitoring to avoid falls.     Family/ staff Communication:   Labs/tests ordered:    Total time spent in this patient care encounter was  _25  minutes; greater than 50% of the visit spent counseling  staff, reviewing records , Labs and coordinating care for problems addressed at this encounter.

## 2018-08-22 DEATH — deceased

## 2018-09-22 DEATH — deceased

## 2020-11-25 IMAGING — DX DG CHEST 1V
1 series · 1 of 1 positions shown · non-contrast
Comparison: August 08, 2014

CLINICAL DATA: Suicidal ideation.  Altered mental status.

EXAM:
CHEST  1 VIEW

[chest ap]
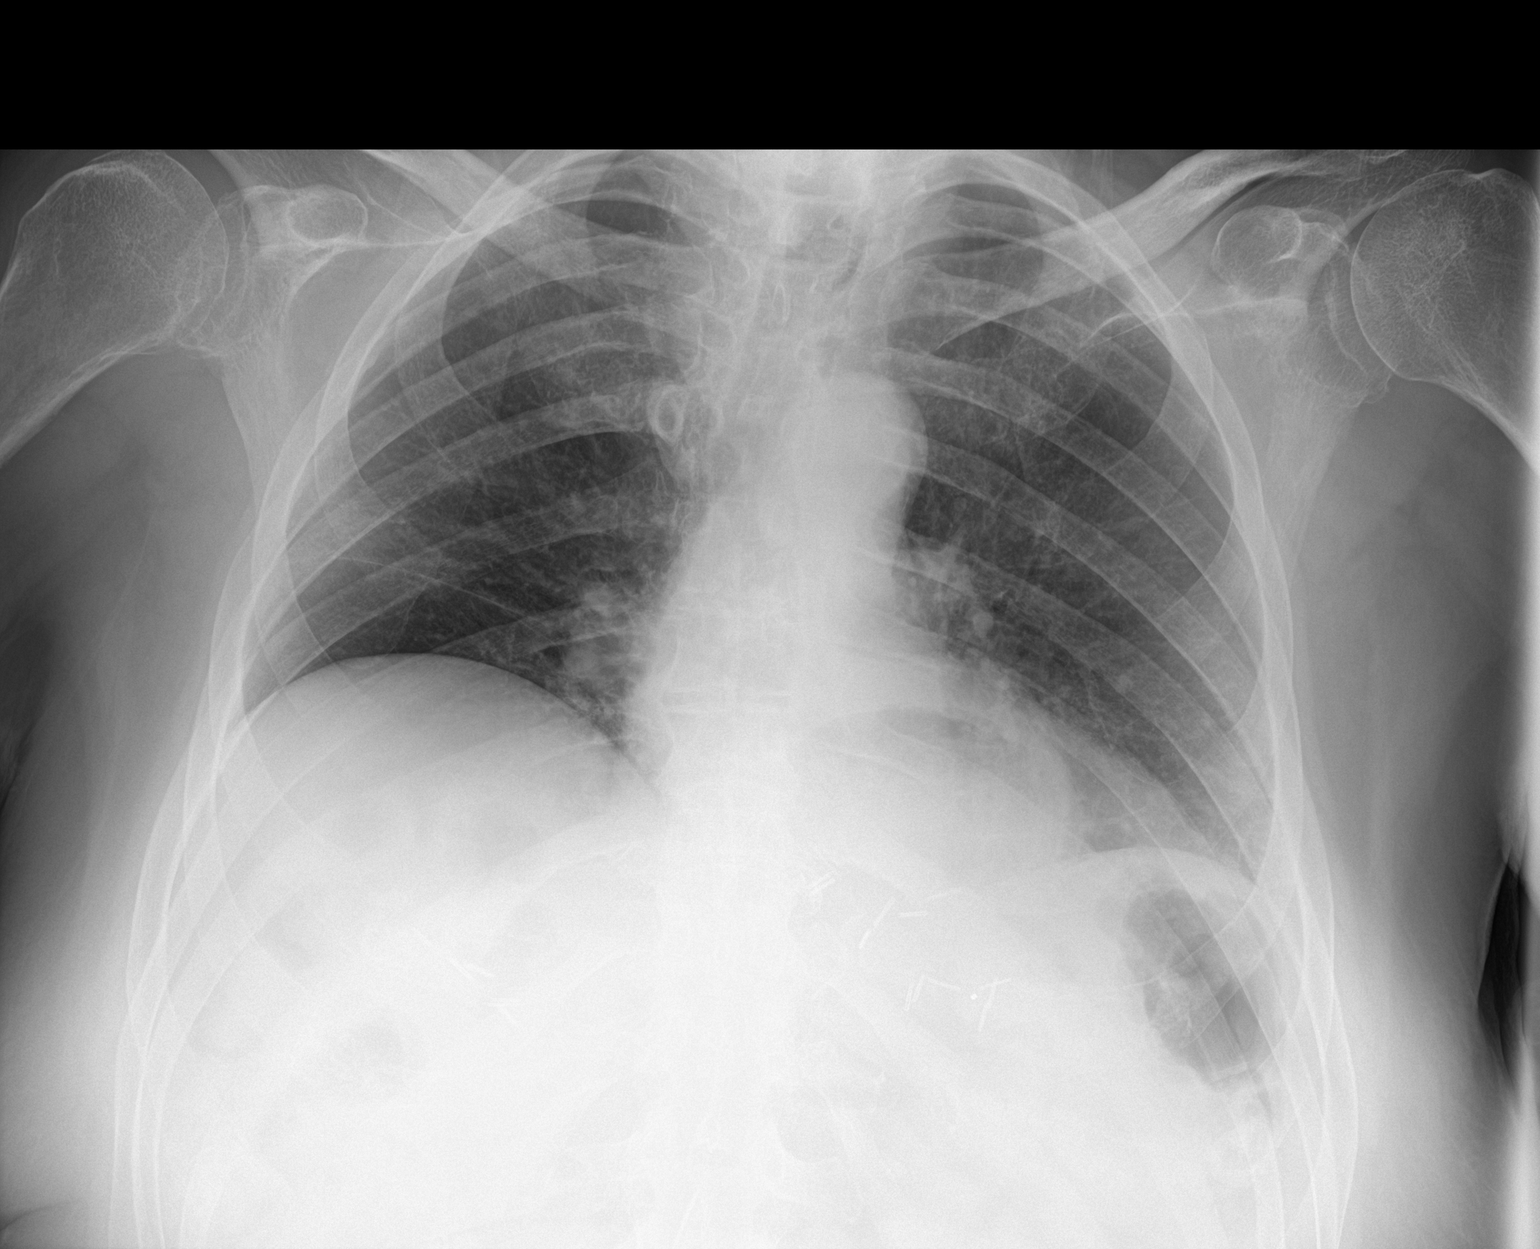

[1 of 1 positions shown; findings below may reference images not displayed]

FINDINGS: A moderate hiatal hernia is identified. The heart, hila,
mediastinum, lungs, and pleura are otherwise unremarkable.
IMPRESSION: Hiatal hernia.  No other abnormalities.
